# Patient Record
Sex: Female | Born: 1995 | Race: Black or African American | Hispanic: No | Marital: Single
Health system: Southern US, Community
[De-identification: ages and names within clinical notes are randomized; demographics above are authoritative.]

## PROBLEM LIST (undated history)

## (undated) ENCOUNTER — Inpatient Hospital Stay (HOSPITAL_COMMUNITY): Payer: Self-pay

## (undated) DIAGNOSIS — T7840XA Allergy, unspecified, initial encounter: Secondary | ICD-10-CM

## (undated) DIAGNOSIS — K5909 Other constipation: Secondary | ICD-10-CM

## (undated) DIAGNOSIS — Z91410 Personal history of adult physical and sexual abuse: Secondary | ICD-10-CM

## (undated) HISTORY — DX: Personal history of adult physical and sexual abuse: Z91.410

## (undated) HISTORY — PX: NO PAST SURGERIES: SHX2092

## (undated) HISTORY — DX: Other constipation: K59.09

---
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
## 2013-01-14 ENCOUNTER — Inpatient Hospital Stay (HOSPITAL_COMMUNITY)
Admission: AD | Admit: 2013-01-14 | Discharge: 2013-01-14 | Disposition: A | Discharge status: Home / Self Care | Payer: BC Managed Care – PPO | Source: Ambulatory Visit | Attending: Obstetrics and Gynecology | Admitting: Obstetrics and Gynecology

## 2013-01-14 DIAGNOSIS — R109 Unspecified abdominal pain: Secondary | ICD-10-CM | POA: Insufficient documentation

## 2013-01-14 DIAGNOSIS — B9789 Other viral agents as the cause of diseases classified elsewhere: Secondary | ICD-10-CM | POA: Insufficient documentation

## 2013-01-14 DIAGNOSIS — J029 Acute pharyngitis, unspecified: Secondary | ICD-10-CM | POA: Insufficient documentation

## 2013-01-14 DIAGNOSIS — B349 Viral infection, unspecified: Secondary | ICD-10-CM

## 2013-01-14 DIAGNOSIS — M542 Cervicalgia: Secondary | ICD-10-CM | POA: Insufficient documentation

## 2013-01-14 LAB — RAPID STREP SCREEN (MED CTR MEBANE ONLY): Streptococcus, Group A Screen (Direct): NEGATIVE

## 2013-01-14 LAB — URINALYSIS, ROUTINE W REFLEX MICROSCOPIC
Hgb urine dipstick: NEGATIVE
Leukocytes, UA: NEGATIVE
Nitrite: NEGATIVE
Specific Gravity, Urine: 1.02 (ref 1.005–1.030)
Urobilinogen, UA: 0.2 mg/dL (ref 0.0–1.0)
pH: 6 (ref 5.0–8.0)

## 2013-01-14 LAB — POCT PREGNANCY, URINE: Preg Test, Ur: NEGATIVE

## 2013-01-14 MED ORDER — ACETAMINOPHEN 325 MG PO TABS: 650.0000 mg | ORAL_TABLET | Freq: Once | ORAL | Status: DC

## 2013-01-14 MED ORDER — IBUPROFEN 600 MG PO TABS
600.0000 mg | ORAL_TABLET | Freq: Once | ORAL | Status: AC
  Administered 2013-01-14: 600 mg via ORAL
  Filled 2013-01-14: qty 1

## 2013-01-14 NOTE — MAU Provider Note (Signed)
History     CSN: [id number]  Arrival date and time: 01/14/13 3086   Seen by provider at 0710     Chief Complaint  Patient presents with  . Abdominal Pain  . Sore Throat  . Neck Pain   HPI Bailey Carlson 17 y.o. Comes to MAU today with sore throat, neck pain and abdominal pain.  Took tylenol at 3 am and it did not help.  Has vomited phlegm.  Has not had menses in 3 months.  Is taking birth control pills and reports taking pills daily.  [facility].  OB History    Grav Para Term Preterm Abortions TAB SAB Ect Mult Living   0               Past Medical History  Diagnosis Date  . Chronic constipation   . Allergy     Past Surgical History  Procedure Date  . No past surgeries     Family History  Problem Relation Age of Onset  . Hirschsprung's disease Neg Hx   . Hypertension Mother   . Hypertension Father     History  Substance Use Topics  . Smoking status: Current Every Day Smoker -- 0.2 packs/day  . Smokeless tobacco: Current User  . Alcohol Use: 1.2 oz/week    2 Shots of liquor per week     Comment: one Blunt every month    Allergies:  Allergies  Allergen Reactions  . Food Hives and Rash    Bananas and Apples    Prescriptions prior to admission  Medication Sig Dispense Refill  . desonide (DESOWEN) 0.05 % cream Apply 1 application topically 2 (two) times daily.      . Multiple Vitamin (MULTIVITAMIN WITH MINERALS) TABS Take 1 tablet by mouth daily.        Review of Systems  Constitutional: Positive for fever.  HENT: Positive for sore throat and neck pain.   Gastrointestinal: Positive for nausea, vomiting and abdominal pain. Negative for diarrhea and constipation.  Genitourinary:       No vaginal discharge. No vaginal bleeding. No dysuria.   Physical Exam   Blood pressure 127/88, pulse 130, resp. rate 18, height 5\' 2"  (1.575 m), weight 82.781 kg (182 lb 8 oz), last menstrual period 11/14/2012. Temp 100.8, on recheck  pulse was 112 Physical Exam  Nursing note and vitals reviewed. Constitutional: She is oriented to person, place, and time. She appears well-developed and well-nourished.  HENT:  Head: Normocephalic.       Throat very light pink.  No exudate on tonsils.  No nasal congestion.  No cough.  Eyes: EOM are normal.  Neck: Neck supple.       No lymph nodes palpated.  Soreness is at the base of her neck.  Can touch her chin to her chest with no pain.  GI: Soft. Bowel sounds are normal. There is no tenderness. There is no rebound and no guarding.       Very mild tenderness in LLQ.  No pain with palpation.  Musculoskeletal: Normal range of motion.  Neurological: She is alert and oriented to person, place, and time.  Skin: Skin is warm and dry.  Psychiatric: She has a normal mood and affect.    MAU Course  Procedures Results for orders placed during the hospital encounter of 01/14/13 (from the past 24 hour(s))  URINALYSIS, ROUTINE W REFLEX MICROSCOPIC     Status: Normal   Collection Time  01/14/13  6:34 AM      Component Value Range   Color, Urine YELLOW  YELLOW   APPearance CLEAR  CLEAR   Specific Gravity, Urine 1.020  1.005 - 1.030   pH 6.0  5.0 - 8.0   Glucose, UA NEGATIVE  NEGATIVE mg/dL   Hgb urine dipstick NEGATIVE  NEGATIVE   Bilirubin Urine NEGATIVE  NEGATIVE   Ketones, ur NEGATIVE  NEGATIVE mg/dL   Protein, ur NEGATIVE  NEGATIVE mg/dL   Urobilinogen, UA 0.2  0.0 - 1.0 mg/dL   Nitrite NEGATIVE  NEGATIVE   Leukocytes, UA NEGATIVE  NEGATIVE  POCT PREGNANCY, URINE     Status: Normal   Collection Time   01/14/13  6:40 AM      Component Value Range   Preg Test, Ur NEGATIVE  NEGATIVE   MDM Strep test done - results pending 0740 Consult with Dr. Tenny Craw re: plan of care  Assessment and Plan  Sore throat Viral syndrome  Plan Alternate ibuprofen and tylenol according to the package directions. Drink at least 8 8-oz glasses of water every day. Increase rest in bed. Seek additional  care at Urgent Care if your condition worsens.  BURLESON,TERRI 01/14/2013, 7:24 AM

## 2013-01-14 NOTE — MAU Note (Signed)
Sore throat, stomach ache, and neck pains for the last 24 hours.

## 2013-01-24 ENCOUNTER — Emergency Department (HOSPITAL_COMMUNITY)
Admission: EM | Admit: 2013-01-24 | Discharge: 2013-01-24 | Disposition: A | Discharge status: Home / Self Care | Payer: BC Managed Care – PPO | Attending: Emergency Medicine | Admitting: Emergency Medicine

## 2013-01-24 ENCOUNTER — Encounter (HOSPITAL_COMMUNITY): Payer: Self-pay | Admitting: *Deleted

## 2013-01-24 DIAGNOSIS — R109 Unspecified abdominal pain: Secondary | ICD-10-CM | POA: Insufficient documentation

## 2013-01-24 DIAGNOSIS — M545 Low back pain, unspecified: Secondary | ICD-10-CM | POA: Insufficient documentation

## 2013-01-24 DIAGNOSIS — Z79899 Other long term (current) drug therapy: Secondary | ICD-10-CM | POA: Insufficient documentation

## 2013-01-24 DIAGNOSIS — F17200 Nicotine dependence, unspecified, uncomplicated: Secondary | ICD-10-CM | POA: Insufficient documentation

## 2013-01-24 DIAGNOSIS — N898 Other specified noninflammatory disorders of vagina: Secondary | ICD-10-CM | POA: Insufficient documentation

## 2013-01-24 DIAGNOSIS — Z3202 Encounter for pregnancy test, result negative: Secondary | ICD-10-CM | POA: Insufficient documentation

## 2013-01-24 DIAGNOSIS — Z8719 Personal history of other diseases of the digestive system: Secondary | ICD-10-CM | POA: Insufficient documentation

## 2013-01-24 DIAGNOSIS — K5289 Other specified noninfective gastroenteritis and colitis: Secondary | ICD-10-CM | POA: Insufficient documentation

## 2013-01-24 DIAGNOSIS — K529 Noninfective gastroenteritis and colitis, unspecified: Secondary | ICD-10-CM

## 2013-01-24 DIAGNOSIS — R197 Diarrhea, unspecified: Secondary | ICD-10-CM | POA: Insufficient documentation

## 2013-01-24 LAB — POCT I-STAT, CHEM 8
BUN: 7 mg/dL (ref 6–23)
Calcium, Ion: 1.2 mmol/L (ref 1.12–1.23)
Chloride: 104 meq/L (ref 96–112)
Creatinine, Ser: 0.8 mg/dL (ref 0.50–1.10)
Glucose, Bld: 113 mg/dL — ABNORMAL HIGH (ref 70–99)
HCT: 45 % (ref 36.0–46.0)
Hemoglobin: 15.3 g/dL — ABNORMAL HIGH (ref 12.0–15.0)
Potassium: 3.9 meq/L (ref 3.5–5.1)
Sodium: 140 meq/L (ref 135–145)
TCO2: 26 mmol/L (ref 0–100)

## 2013-01-24 LAB — URINALYSIS, ROUTINE W REFLEX MICROSCOPIC
Bilirubin Urine: NEGATIVE
Glucose, UA: NEGATIVE mg/dL
Ketones, ur: NEGATIVE mg/dL
Leukocytes, UA: NEGATIVE
Protein, ur: NEGATIVE mg/dL
Urobilinogen, UA: 0.2 mg/dL (ref 0.0–1.0)

## 2013-01-24 LAB — URINE MICROSCOPIC-ADD ON

## 2013-01-24 LAB — POCT PREGNANCY, URINE: Preg Test, Ur: NEGATIVE

## 2013-01-24 MED ORDER — KETOROLAC TROMETHAMINE 30 MG/ML IJ SOLN
30.0000 mg | Freq: Once | INTRAMUSCULAR | Status: AC
  Administered 2013-01-24: 30 mg via INTRAVENOUS
  Filled 2013-01-24: qty 1

## 2013-01-24 MED ORDER — SODIUM CHLORIDE 0.9 % IV BOLUS (SEPSIS)
1000.0000 mL | Freq: Once | INTRAVENOUS | Status: AC
  Administered 2013-01-24: 1000 mL via INTRAVENOUS

## 2013-01-24 MED ORDER — ONDANSETRON HCL 4 MG/2ML IJ SOLN
4.0000 mg | Freq: Once | INTRAMUSCULAR | Status: AC
  Administered 2013-01-24: 4 mg via INTRAVENOUS
  Filled 2013-01-24: qty 2

## 2013-01-24 MED ORDER — ONDANSETRON HCL 4 MG PO TABS: 4.0000 mg | ORAL_TABLET | Freq: Four times a day (QID) | ORAL | Status: DC

## 2013-01-24 NOTE — ED Notes (Signed)
Pt brought to room; pt getting undressed and into a gown at this time 

## 2013-01-24 NOTE — ED Notes (Signed)
Pt laying on stretcher talking on her cell phone, no needs at this time

## 2013-01-24 NOTE — ED Provider Notes (Signed)
History     CSN: [id number]  Arrival date & time 01/24/13  1428   First MD Initiated Contact with Patient 01/24/13 1513      Chief Complaint  Patient presents with  . Nausea  . Emesis  . Diarrhea    (Consider location/radiation/quality/duration/timing/severity/associated sxs/prior treatment) Patient is a 17 y.o. female presenting with vomiting and diarrhea. The history is provided by the patient.  Emesis  This is a new problem. The problem occurs more than 10 times per day. The emesis has an appearance of stomach contents. There has been no fever. Associated symptoms include abdominal pain and diarrhea. Pertinent negatives include no chills, no cough, no fever, no headaches and no myalgias. Associated symptoms comments: N, V, D onset this morning around 5:00 a.m. with multiple episodes of both vomiting and diarrhea without blood in either. No fever. She has lower abdominal and low back pain that she attributes to an usually heavy period. No vaginal discharge, dysuria, near syncope or dizziness. She denies URI symptoms. .  Diarrhea The primary symptoms include abdominal pain, vomiting and diarrhea. Primary symptoms do not include fever, dysuria, myalgias or rash.  The illness is also significant for back pain. The illness does not include chills.    Past Medical History  Diagnosis Date  . Chronic constipation   . Allergy     Past Surgical History  Procedure Date  . No past surgeries     Family History  Problem Relation Age of Onset  . Hirschsprung's disease Neg Hx   . Hypertension Mother   . Hypertension Father     History  Substance Use Topics  . Smoking status: Current Every Day Smoker -- 0.2 packs/day  . Smokeless tobacco: Current User  . Alcohol Use: 1.2 oz/week    2 Shots of liquor per week     Comment: one Blunt every month    OB History    Grav Para Term Preterm Abortions TAB SAB Ect Mult Living   0               Review of Systems  Constitutional:  Negative for fever and chills.  HENT: Negative.  Negative for congestion and trouble swallowing.   Respiratory: Negative.  Negative for cough and shortness of breath.   Cardiovascular: Negative.  Negative for chest pain.  Gastrointestinal: Positive for vomiting, abdominal pain and diarrhea. Negative for blood in stool.  Genitourinary: Positive for vaginal bleeding. Negative for dysuria and vaginal discharge.  Musculoskeletal: Positive for back pain. Negative for myalgias.  Skin: Negative.  Negative for rash.  Neurological: Negative.  Negative for headaches.  Hematological: Does not bruise/bleed easily.  Psychiatric/Behavioral: Negative for confusion.    Allergies  Food  Home Medications   Current Outpatient Rx  Name  Route  Sig  Dispense  Refill  . DESONIDE 0.05 % EX CREA   Topical   Apply 1 application topically 2 (two) times daily.         . ADULT MULTIVITAMIN W/MINERALS CH   Oral   Take 1 tablet by mouth daily.           BP 142/88  Pulse 121  Temp 98 F (36.7 C) (Oral)  Resp 18  SpO2 99%  LMP 11/14/2012  Physical Exam  Constitutional: She is oriented to person, place, and time. She appears well-developed and well-nourished.  HENT:  Head: Normocephalic.  Mouth/Throat: Oropharynx is clear and moist.  Neck: Normal range of motion. Neck supple.  Cardiovascular: Regular rhythm.  Tachycardia present.   No murmur heard. Pulmonary/Chest: Effort normal and breath sounds normal. She has no wheezes. She has no rales.  Abdominal: Soft. Bowel sounds are normal. There is no tenderness. There is no rebound and no guarding.  Musculoskeletal: Normal range of motion. She exhibits no edema.  Neurological: She is alert and oriented to person, place, and time.  Skin: Skin is warm and dry. No rash noted.  Psychiatric: She has a normal mood and affect.    ED Course  Procedures (including critical care time)  Labs Reviewed  URINALYSIS, ROUTINE W REFLEX MICROSCOPIC - Abnormal;  Notable for the following:    Hgb urine dipstick MODERATE (*)     All other components within normal limits  POCT I-STAT, CHEM 8 - Abnormal; Notable for the following:    Glucose, Bld 113 (*)     Hemoglobin 15.3 (*)     All other components within normal limits  POCT PREGNANCY, URINE  URINE MICROSCOPIC-ADD ON   Results for orders placed during the hospital encounter of 01/24/13  URINALYSIS, ROUTINE W REFLEX MICROSCOPIC      Component Value Range   Color, Urine YELLOW  YELLOW   APPearance CLEAR  CLEAR   Specific Gravity, Urine 1.029  1.005 - 1.030   pH 6.0  5.0 - 8.0   Glucose, UA NEGATIVE  NEGATIVE mg/dL   Hgb urine dipstick MODERATE (*) NEGATIVE   Bilirubin Urine NEGATIVE  NEGATIVE   Ketones, ur NEGATIVE  NEGATIVE mg/dL   Protein, ur NEGATIVE  NEGATIVE mg/dL   Urobilinogen, UA 0.2  0.0 - 1.0 mg/dL   Nitrite NEGATIVE  NEGATIVE   Leukocytes, UA NEGATIVE  NEGATIVE  POCT PREGNANCY, URINE      Component Value Range   Preg Test, Ur NEGATIVE  NEGATIVE  URINE MICROSCOPIC-ADD ON      Component Value Range   Squamous Epithelial / LPF RARE  RARE   WBC, UA 0-2  <3 WBC/hpf   RBC / HPF 0-2  <3 RBC/hpf   Urine-Other MUCOUS PRESENT    POCT I-STAT, CHEM 8      Component Value Range   Sodium 140  135 - 145 mEq/L   Potassium 3.9  3.5 - 5.1 mEq/L   Chloride 104  96 - 112 mEq/L   BUN 7  6 - 23 mg/dL   Creatinine, Ser 1.61  0.50 - 1.10 mg/dL   Glucose, Bld 096 (*) 70 - 99 mg/dL   Calcium, Ion 0.45  1.12 - 1.23 mmol/L   TCO2 26  0 - 100 mmol/L   Hemoglobin 15.3 (*) 12.0 - 15.0 g/dL   HCT 40.9  81.1 - 91.4 %    No results found.   No diagnosis found.  1. Gastroenteritis 2. menstrual cramps   MDM  She is feeling better after IV fluids, Toradol and Zofran. Tolerating PO fluids without difficulty. No further vomiting. No diarrhea in ED. Vital signs improved. She reports period is heavier than usual, denies vaginal discharge and states "my back pain is from my period". Do not feel  further evaluation is required. Stable for discharge.         Arnoldo Hooker, PA-C 01/24/13 1738

## 2013-01-24 NOTE — ED Notes (Signed)
Oral fluid trial given; will report to [place], [place]

## 2013-01-24 NOTE — ED Notes (Signed)
Patient reports onset of n/v/d today at 0100.  She states she did not have a period for 2 mths,  She started bleeding on yesterday only small amount.  Patient states today she is having heavy period with clots.  Patient is also complaining of back pain

## 2013-01-24 NOTE — ED Provider Notes (Signed)
Medical screening examination/treatment/procedure(s) were performed by non-physician practitioner and as supervising physician I was immediately available for consultation/collaboration. Devoria Albe, MD, Armando Gang   Ward Givens, MD 01/24/13 [phone]

## 2013-03-02 ENCOUNTER — Encounter (HOSPITAL_COMMUNITY): Payer: Self-pay

## 2013-03-02 DIAGNOSIS — Z3202 Encounter for pregnancy test, result negative: Secondary | ICD-10-CM | POA: Insufficient documentation

## 2013-03-02 DIAGNOSIS — R112 Nausea with vomiting, unspecified: Secondary | ICD-10-CM | POA: Insufficient documentation

## 2013-03-02 DIAGNOSIS — F17200 Nicotine dependence, unspecified, uncomplicated: Secondary | ICD-10-CM | POA: Insufficient documentation

## 2013-03-02 LAB — COMPREHENSIVE METABOLIC PANEL
ALT: 12 U/L (ref 0–35)
AST: 22 U/L (ref 0–37)
Albumin: 4 g/dL (ref 3.5–5.2)
Alkaline Phosphatase: 64 U/L (ref 39–117)
BUN: 10 mg/dL (ref 6–23)
Chloride: 99 mEq/L (ref 96–112)
Potassium: 3.6 mEq/L (ref 3.5–5.1)
Sodium: 136 mEq/L (ref 135–145)
Total Bilirubin: 0.3 mg/dL (ref 0.3–1.2)
Total Protein: 8.8 g/dL — ABNORMAL HIGH (ref 6.0–8.3)

## 2013-03-02 LAB — URINALYSIS, MICROSCOPIC ONLY
Glucose, UA: NEGATIVE mg/dL
Ketones, ur: NEGATIVE mg/dL
Leukocytes, UA: NEGATIVE
Nitrite: NEGATIVE
Protein, ur: NEGATIVE mg/dL
Specific Gravity, Urine: 1.036 — ABNORMAL HIGH (ref 1.005–1.030)

## 2013-03-02 LAB — CBC WITH DIFFERENTIAL/PLATELET
Basophils Absolute: 0 10*3/uL (ref 0.0–0.1)
Basophils Relative: 0 % (ref 0–1)
Eosinophils Absolute: 0.2 10*3/uL (ref 0.0–0.7)
Eosinophils Relative: 1 % (ref 0–5)
MCH: 29.3 pg (ref 26.0–34.0)
MCHC: 35.3 g/dL (ref 30.0–36.0)
MCV: 83.1 fL (ref 78.0–100.0)
Platelets: 304 10*3/uL (ref 150–400)
RDW: 14 % (ref 11.5–15.5)

## 2013-03-02 LAB — LIPASE, BLOOD: Lipase: 25 U/L (ref 11–59)

## 2013-03-02 NOTE — ED Notes (Signed)
Re-assessed w/ VS. Patient reports feeling better after most recent episode of emesis. Still has "bad" headache. Patient back to lobby to continue to wait on room placement

## 2013-03-02 NOTE — ED Notes (Signed)
Patient reports an acute onset of lower abdominal pain, nausea and vomiting since 1830. Ate at [facility] around 2 pm and hasn't had anything else to eat since then. Also complains of a headache. Denies constipation or diarrhea. LMP end of January

## 2013-03-02 NOTE — ED Notes (Signed)
Nausea and vomiting in triage room #3.

## 2013-03-03 ENCOUNTER — Emergency Department (HOSPITAL_COMMUNITY)
Admission: EM | Admit: 2013-03-03 | Discharge: 2013-03-03 | Discharge status: Against Medical Advice | Payer: BC Managed Care – PPO | Attending: Emergency Medicine | Admitting: Emergency Medicine

## 2014-10-06 ENCOUNTER — Emergency Department (HOSPITAL_COMMUNITY)
Admission: EM | Admit: 2014-10-06 | Discharge: 2014-10-06 | Disposition: A | Discharge status: Home / Self Care | Payer: BC Managed Care – PPO | Attending: Emergency Medicine | Admitting: Emergency Medicine

## 2014-10-06 ENCOUNTER — Encounter (HOSPITAL_COMMUNITY): Payer: Self-pay | Admitting: Emergency Medicine

## 2014-10-06 DIAGNOSIS — Z3202 Encounter for pregnancy test, result negative: Secondary | ICD-10-CM | POA: Diagnosis not present

## 2014-10-06 DIAGNOSIS — T483X2A Poisoning by antitussives, intentional self-harm, initial encounter: Secondary | ICD-10-CM | POA: Diagnosis not present

## 2014-10-06 DIAGNOSIS — Z720 Tobacco use: Secondary | ICD-10-CM | POA: Insufficient documentation

## 2014-10-06 DIAGNOSIS — R112 Nausea with vomiting, unspecified: Secondary | ICD-10-CM | POA: Diagnosis present

## 2014-10-06 DIAGNOSIS — F4321 Adjustment disorder with depressed mood: Secondary | ICD-10-CM | POA: Diagnosis present

## 2014-10-06 DIAGNOSIS — Z8719 Personal history of other diseases of the digestive system: Secondary | ICD-10-CM | POA: Diagnosis not present

## 2014-10-06 DIAGNOSIS — T450X2A Poisoning by antiallergic and antiemetic drugs, intentional self-harm, initial encounter: Secondary | ICD-10-CM

## 2014-10-06 DIAGNOSIS — T484X2A Poisoning by expectorants, intentional self-harm, initial encounter: Secondary | ICD-10-CM

## 2014-10-06 DIAGNOSIS — Y939 Activity, unspecified: Secondary | ICD-10-CM | POA: Diagnosis not present

## 2014-10-06 DIAGNOSIS — Y929 Unspecified place or not applicable: Secondary | ICD-10-CM | POA: Diagnosis not present

## 2014-10-06 DIAGNOSIS — R10812 Left upper quadrant abdominal tenderness: Secondary | ICD-10-CM | POA: Diagnosis not present

## 2014-10-06 LAB — COMPREHENSIVE METABOLIC PANEL
ALT: 8 U/L (ref 0–35)
ANION GAP: 17 — AB (ref 5–15)
AST: 18 U/L (ref 0–37)
Albumin: 4.3 g/dL (ref 3.5–5.2)
Alkaline Phosphatase: 60 U/L (ref 39–117)
BILIRUBIN TOTAL: 0.3 mg/dL (ref 0.3–1.2)
BUN: 7 mg/dL (ref 6–23)
CHLORIDE: 101 meq/L (ref 96–112)
CO2: 22 mEq/L (ref 19–32)
CREATININE: 0.75 mg/dL (ref 0.50–1.10)
Calcium: 9.4 mg/dL (ref 8.4–10.5)
GFR calc non Af Amer: >90 mL/min (ref >90)
Glucose, Bld: 111 mg/dL — ABNORMAL HIGH (ref 70–99)
Potassium: 3.4 mEq/L — ABNORMAL LOW (ref 3.7–5.3)
Sodium: 140 mEq/L (ref 137–147)
Total Protein: 9.1 g/dL — ABNORMAL HIGH (ref 6.0–8.3)

## 2014-10-06 LAB — CBC WITH DIFFERENTIAL/PLATELET
Basophils Absolute: 0 10*3/uL (ref 0.0–0.1)
Basophils Relative: 0 % (ref 0–1)
Eosinophils Absolute: 0 10*3/uL (ref 0.0–0.7)
Eosinophils Relative: 0 % (ref 0–5)
HEMATOCRIT: 36.1 % (ref 36.0–46.0)
Hemoglobin: 12.4 g/dL (ref 12.0–15.0)
LYMPHS PCT: 12 % (ref 12–46)
Lymphs Abs: 2.2 10*3/uL (ref 0.7–4.0)
MCH: 28.6 pg (ref 26.0–34.0)
MCHC: 34.3 g/dL (ref 30.0–36.0)
MCV: 83.4 fL (ref 78.0–100.0)
MONO ABS: 0.5 10*3/uL (ref 0.1–1.0)
MONOS PCT: 3 % (ref 3–12)
NEUTROS ABS: 15.5 10*3/uL — AB (ref 1.7–7.7)
Neutrophils Relative %: 85 % — ABNORMAL HIGH (ref 43–77)
Platelets: 354 10*3/uL (ref 150–400)
RBC: 4.33 MIL/uL (ref 3.87–5.11)
RDW: 13.6 % (ref 11.5–15.5)
WBC: 18.3 10*3/uL — ABNORMAL HIGH (ref 4.0–10.5)

## 2014-10-06 LAB — URINALYSIS, ROUTINE W REFLEX MICROSCOPIC
Bilirubin Urine: NEGATIVE
Glucose, UA: NEGATIVE mg/dL
KETONES UR: NEGATIVE mg/dL
Leukocytes, UA: NEGATIVE
Nitrite: NEGATIVE
PROTEIN: NEGATIVE mg/dL
Specific Gravity, Urine: 1.008 (ref 1.005–1.030)
Urobilinogen, UA: 0.2 mg/dL (ref 0.0–1.0)
pH: 6 (ref 5.0–8.0)

## 2014-10-06 LAB — URINE MICROSCOPIC-ADD ON

## 2014-10-06 LAB — RAPID URINE DRUG SCREEN, HOSP PERFORMED
AMPHETAMINES: NOT DETECTED
Barbiturates: NOT DETECTED
Benzodiazepines: NOT DETECTED
Cocaine: NOT DETECTED
OPIATES: NOT DETECTED
TETRAHYDROCANNABINOL: POSITIVE — AB

## 2014-10-06 LAB — ACETAMINOPHEN LEVEL
Acetaminophen (Tylenol), Serum: <15 ug/mL (ref 10–30)
Acetaminophen (Tylenol), Serum: <15 ug/mL (ref 10–30)

## 2014-10-06 LAB — PREGNANCY, URINE: Preg Test, Ur: NEGATIVE

## 2014-10-06 LAB — ETHANOL

## 2014-10-06 LAB — SALICYLATE LEVEL: Salicylate Lvl: <2 mg/dL — ABNORMAL LOW (ref 2.8–20.0)

## 2014-10-06 MED ORDER — ONDANSETRON HCL 4 MG/2ML IJ SOLN: 4.0000 mg | Freq: Once | INTRAMUSCULAR | Status: DC

## 2014-10-06 NOTE — BH Assessment (Signed)
Discharge home per Dr. Jannifer FranklinAkintayo with outpatient therapy. Patient given the following appointment but sts she is unable to make it tomorrow due to work. Patient instructed to contact the provider listed below and coordinate another appointment time. Dr. Jannifer FranklinAkintayo made aware of appointment.  Appointment (Thursday, October 07, 2014 @ 1pm) Dierdre Highmanhristine Enderline M.Ed., [facility], [facility], [facility] [address] [place], [place] [address] [phone]

## 2014-10-06 NOTE — BHH Suicide Risk Assessment (Signed)
   Demographic Factors:  Female and employed  Total Time spent with patient: 20 minutes  Psychiatric Specialty Exam: Physical Exam  ROS  Blood pressure 119/69, pulse 93, temperature 98 F (36.7 C), temperature source Oral, resp. rate 18, SpO2 98.00%.There is no weight on file to calculate BMI.  General Appearance: Fairly Groomed  Patent attorneyye Contact::  Negative  Speech:  Clear and Coherent  Volume:  Normal  Mood:  Euthymic  Affect:  Appropriate  Thought Process:  Goal Directed  Orientation:  Full (Time, Place, and Person)  Thought Content:  Negative  Suicidal Thoughts:  No  Homicidal Thoughts:  No  Memory:  Immediate;   Fair Recent;   Fair Remote;   Fair  Judgement:  Fair  Insight:  Fair  Psychomotor Activity:  Normal  Concentration:  Good  Recall:  Good  Fund of Knowledge:Good  Language: Good  Akathisia:  No  Handed:  Right  AIMS (if indicated):     Assets:  Communication Skills Desire for Improvement Physical Health  Sleep:       Musculoskeletal: Strength & Muscle Tone: within normal limits Gait & Station: normal Patient leans: N/A   Mental Status Per Nursing Assessment::   On Admission:     Current Mental Status by Physician: patient denies suicidal ideation, intent or plan  Loss Factors: NA  Historical Factors: NA  Risk Reduction Factors:   Sense of responsibility to family, Living with another person, especially a relative and Positive social support  Continued Clinical Symptoms:  Resolving depression  Cognitive Features That Contribute To Risk:  Closed-mindedness    Suicide Risk:  Minimal: No identifiable suicidal ideation.  Patients presenting with no risk factors but with morbid ruminations; may be classified as minimal risk based on the severity of the depressive symptoms  Discharge Diagnoses:   AXIS I:  Adjustment Disorder with Depressed Mood AXIS II:  Deferred AXIS III:   Past Medical History  Diagnosis Date  . Chronic constipation   .  Allergy    AXIS IV:  other psychosocial or environmental problems and problems related to social environment AXIS V:  61-70 mild symptoms  Plan Of Care/Follow-up recommendations:  Activity:  as tolerated Diet:  healthy  Is patient on multiple antipsychotic therapies at discharge:  No   Has Patient had three or more failed trials of antipsychotic monotherapy by history:  No  Recommended Plan for Multiple Antipsychotic Therapies: NA    Thedore MinsAkintayo, Layah Skousen, MD 10/06/2014, 10:15 AM

## 2014-10-06 NOTE — Discharge Instructions (Signed)
Adjustment Disorder °Most changes in life can cause stress. Getting used to changes may take a few months or longer. If feelings of stress, hopelessness, or worry continue, you may have an adjustment disorder. This stress-related mental health problem may affect your feelings, thinking and how you act. It occurs in both sexes and happens at any age. °SYMPTOMS  °Some of the following problems may be seen and vary from person to person: °· Sadness or depression. °· Loss of enjoyment. °· Thoughts of suicide. °· Fighting. °· Avoiding family and friends. °· Poor school performance. °· Hopelessness, sense of loss. °· Trouble sleeping. °· Vandalism. °· Worry, weight loss or gain. °· Crying spells. °· Anxiety °· Reckless driving. °· Skipping school. °· Poor work performance. °· Nervousness. °· Ignoring bills. °· Poor attitude. °DIAGNOSIS  °Your caregiver will ask what has happened in your life and do a physical exam. They will make a diagnosis of an adjustment disorder when they are sure another problem or medical illness causing your feelings does not exist. °TREATMENT  °When problems caused by stress interfere with you daily life or last longer than a few months, you may need counseling for an adjustment disorder. Early treatment may diminish problems and help you to better cope with the stressful events in your life. Sometimes medication is necessary. Individual counseling and or support groups can be very helpful. °PROGNOSIS  °Adjustment disorders usually last less than 3 to 6 months. The condition may persist if there is long lasting stress. This could include health problems, relationship problems, or job difficulties where you can not easily escape from what is causing the problem. °PREVENTION  °Even the most mentally healthy, highly functioning people can suffer from an adjustment disorder given a significant blow from a life-changing event. There is no way to prevent pain and loss. Most people need help from time  to time. You are not alone. °SEEK MEDICAL CARE IF:  °Your feelings or symptoms listed above do not improve or worsen. °Document Released: 08/14/2006 Document Revised: 03/03/2012 Document Reviewed: 11/05/2007 °ExitCare® Patient Information ©2015 [employer]. This information is not intended to replace advice given to you by your health care provider. Make sure you discuss any questions you have with your health care provider. ° °

## 2014-10-06 NOTE — ED Notes (Addendum)
Pt's belongings given to [facility] RN and included:  Red Purse with black wallet ($65.0 dollars and 2 credit cards locked up with Security) Cigarette/Lighter Set of keys x 2  Black bra Green Pants Asa SaunasGray Underwear One blue and one purple sock One black and one white t-shirt Blue T-mobile cell phone with Consulting civil engineercharger

## 2014-10-06 NOTE — ED Notes (Signed)
Patient denies SI, HI, AVH at present. Patient has ring and earrings on her person.   Oriented to the unit.  Q 15 safety checks in place.

## 2014-10-06 NOTE — ED Notes (Signed)
Pt states she is feeling better .

## 2014-10-06 NOTE — ED Notes (Signed)
Pt depressed admits to  Overdosing on intention of Benadryl 25 mg quanity 15 and Mucinex (6),  States she just wants to end it all

## 2014-10-06 NOTE — ED Notes (Signed)
Spoke with Patty at poison control,  She said watch for 6 hours post ingestion,  QRS  Widening,  Tachcarydia,  Hypertension,  Agitation,  Sedation,  Too late for charcoal,  ED ekg,  Give Benzos for agitation and seizures,  And IV fluids ,  Check tylenol level at 330 due to Benadryl slows down the gut.

## 2014-10-06 NOTE — ED Provider Notes (Signed)
CSN: [id number]     Arrival date & time 10/06/14  [id number] History   First MD Initiated Contact with Patient 10/06/14 0052     Chief Complaint  Patient presents with  . Ingestion     (Consider location/radiation/quality/duration/timing/severity/associated sxs/prior Treatment) HPI Patient presents with intentional overdose this evening around midnight. She states she took 15 tablets of 25 mg Benadryl and 6/10 Mucinex. She admits to attempting to kill herself. She states she's never been diagnosed with mental illness. She is on no psychiatric medication. She's no previous suicide attempts. States that nothing has been going right in her life and was wanting to end it all. She's had nausea with several episodes of vomiting since overdose. She was a mild left upper quadrant pain and drowsiness. Denies any other coingestants. Patient denies any hallucinations. Past Medical History  Diagnosis Date  . Chronic constipation   . Allergy    Past Surgical History  Procedure Laterality Date  . No past surgeries     Family History  Problem Relation Age of Onset  . Hirschsprung's disease Neg Hx   . Hypertension Mother   . Hypertension Father    History  Substance Use Topics  . Smoking status: Current Every Day Smoker -- 0.25 packs/day  . Smokeless tobacco: Never Used  . Alcohol Use: 1.2 oz/week    2 Shots of liquor per week     Comment: one Blunt every month   OB History   Grav Para Term Preterm Abortions TAB SAB Ect Mult Living   0              Review of Systems  Constitutional: Negative for fever and chills.  Respiratory: Negative for cough and shortness of breath.   Cardiovascular: Negative for chest pain.  Gastrointestinal: Positive for nausea, vomiting and abdominal pain. Negative for diarrhea and constipation.  Musculoskeletal: Negative for back pain, myalgias, neck pain and neck stiffness.  Skin: Negative for rash and wound.  Neurological: Negative for dizziness, syncope,  weakness, numbness and headaches.  Psychiatric/Behavioral: Positive for suicidal ideas. Negative for hallucinations, confusion and agitation.  All other systems reviewed and are negative.     Allergies  Food  Home Medications   Prior to Admission medications   Medication Sig Start Date End Date Taking? Authorizing Provider  diphenhydrAMINE (BENADRYL) 25 MG tablet Take 375 mg by mouth once.   Yes Historical Provider, MD  guaiFENesin (MUCINEX) 600 MG 12 hr tablet Take 3,600 mg by mouth once.   Yes Historical Provider, MD   BP 119/69  Pulse 93  Temp(Src) 98 F (36.7 C) (Oral)  Resp 18  SpO2 98% Physical Exam  Nursing note and vitals reviewed. Constitutional: She is oriented to person, place, and time. She appears well-developed and well-nourished. No distress.  HENT:  Head: Normocephalic and atraumatic.  Mouth/Throat: Oropharynx is clear and moist.  Eyes: EOM are normal. Pupils are equal, round, and reactive to light.  Neck: Normal range of motion. Neck supple.  Cardiovascular: Normal rate and regular rhythm.   Pulmonary/Chest: Effort normal and breath sounds normal. No respiratory distress. She has no wheezes. She has no rales. She exhibits no tenderness.  Abdominal: Soft. Bowel sounds are normal. She exhibits no distension and no mass. There is tenderness (mild left upper quadrant tenderness to palpation.). There is no rebound and no guarding.  Musculoskeletal: Normal range of motion. She exhibits no edema and no tenderness.  Neurological: She is alert and oriented to person, place, and time.  Skin: Skin is warm and dry. No rash noted. No erythema.  Psychiatric:  Dysphoric mood    ED Course  Procedures (including critical care time) Labs Review Labs Reviewed  CBC WITH DIFFERENTIAL - Abnormal; Notable for the following:    WBC 18.3 (*)    Neutrophils Relative % 85 (*)    Neutro Abs 15.5 (*)    All other components within normal limits  COMPREHENSIVE METABOLIC PANEL -  Abnormal; Notable for the following:    Potassium 3.4 (*)    Glucose, Bld 111 (*)    Total Protein 9.1 (*)    Anion gap 17 (*)    All other components within normal limits  URINALYSIS, ROUTINE W REFLEX MICROSCOPIC - Abnormal; Notable for the following:    Hgb urine dipstick TRACE (*)    All other components within normal limits  SALICYLATE LEVEL - Abnormal; Notable for the following:    Salicylate Lvl <2.0 (*)    All other components within normal limits  URINE RAPID DRUG SCREEN (HOSP PERFORMED) - Abnormal; Notable for the following:    Tetrahydrocannabinol POSITIVE (*)    All other components within normal limits  URINE MICROSCOPIC-ADD ON - Abnormal; Notable for the following:    Squamous Epithelial / LPF FEW (*)    Casts HYALINE CASTS (*)    All other components within normal limits  PREGNANCY, URINE  ACETAMINOPHEN LEVEL  ETHANOL  ACETAMINOPHEN LEVEL    Imaging Review No results found.   EKG Interpretation   Date/Time:  Wednesday October 06 2014 00:48:43 EDT Ventricular Rate:  107 PR Interval:  190 QRS Duration: 89 QT Interval:  371 QTC Calculation: 495 R Axis:   79 Text Interpretation:  Sinus tachycardia Probable left atrial enlargement  Borderline repolarization abnormality Borderline prolonged QT interval ED  PHYSICIAN INTERPRETATION AVAILABLE IN [facility] Confirmed by TEST,  Record ([id number]) on 10/08/2014 7:21:05 AM      MDM   Final diagnoses:  Adjustment disorder with depressed mood   RN discussed with poison control. Recommend 6 hours observation postingestion. Labs within normal limits. Anticipate medical clearance and psychiatric evaluation. Observed emergency department without any acute events. Cleared for psychiatric evaluation.  Loren Raceravid Zenya Hickam, MD 10/08/14 [phone]

## 2014-10-06 NOTE — ED Notes (Signed)
Spoke with Patty with [facility]. Provided recent set of vital signs, Acetaminophen level, and update on patients status. Pt has been cleared from [facility].

## 2014-10-06 NOTE — Consult Note (Signed)
[facility] Consult   Reason for Consult: "I overdosed on my medications to kill myself'' Referring Physician:  EDP Shawneequa Ducey is an 18 y.o. female. Total Time spent with patient: 45 minutes  Assessment: AXIS I:  Adjustment Disorder with Depressed Mood AXIS II:  Deferred AXIS III:   Past Medical History  Diagnosis Date  . Chronic constipation   . Allergy    AXIS IV:  other psychosocial or environmental problems and problems related to social environment AXIS V:  51-60 moderate symptoms  Plan:  No evidence of imminent risk to self or others at present.   Patient will be referred to an outpatient therapist  Subjective:   Kevina Bosque is a 18 y.o. female patient admitted with depression and suicide attempt by overdosing.  HPI: Patient who denies previous history of mental illness. Patient reports that she overdosed on Benadryl 25 mg quanity 15 and Mucinex (6) after an arguments with a female friend. She stated that her intention yesterday was to end it all but today patient reports that her action was "dump". She reports feeling overwhelmed, depressed and stressed out since she started working and going to school at the same time. Today, patient denies suicidal/homicidal thoughts, psychosis or delusional thinking. She reports that her mother is supportive and she work as a Engineer, civil (consulting). Patient denies drugs and alcohol abuse but her urine toxicology is positive for Marijuana.   HPI Elements:   Location:  depressed. Timing:  1 day. Context:  argument with a female friend.  Past Psychiatric History: Past Medical History  Diagnosis Date  . Chronic constipation   . Allergy     reports that she has been smoking.  She has never used smokeless tobacco. She reports that she drinks about 1.2 ounces of alcohol per week. She reports that she does not use illicit drugs. Family History  Problem Relation Age of Onset  . Hirschsprung's disease Neg Hx    . Hypertension Mother   . Hypertension Father            Allergies:   Allergies  Allergen Reactions  . Food Hives and Rash    Bananas and Apples    ACT Assessment Complete:  Yes:    Educational Status    Risk to Self: Risk to self with the past 6 months Is patient at risk for suicide?: Yes Substance abuse history and/or treatment for substance abuse?: No  Risk to Others:    Abuse:    Prior Inpatient Therapy:    Prior Outpatient Therapy:    Additional Information:          Objective: Blood pressure 119/69, pulse 93, temperature 98 F (36.7 C), temperature source Oral, resp. rate 18, SpO2 98.00%.There is no weight on file to calculate BMI. Results for orders placed during the hospital encounter of 10/06/14 (from the past 72 hour(s))  CBC WITH DIFFERENTIAL     Status: Abnormal   Collection Time    10/06/14  1:11 AM      Result Value Ref Range   WBC 18.3 (*) 4.0 - 10.5 K/uL   RBC 4.33  3.87 - 5.11 MIL/uL   Hemoglobin 12.4  12.0 - 15.0 g/dL   HCT 36.1  36.0 - 46.0 %   MCV 83.4  78.0 - 100.0 fL   MCH 28.6  26.0 - 34.0 pg   MCHC 34.3  30.0 - 36.0 g/dL   RDW 13.6  11.5 - 15.5 %   Platelets 354  150 - 400 K/uL   Neutrophils Relative % 85 (*) 43 - 77 %   Neutro Abs 15.5 (*) 1.7 - 7.7 K/uL   Lymphocytes Relative 12  12 - 46 %   Lymphs Abs 2.2  0.7 - 4.0 K/uL   Monocytes Relative 3  3 - 12 %   Monocytes Absolute 0.5  0.1 - 1.0 K/uL   Eosinophils Relative 0  0 - 5 %   Eosinophils Absolute 0.0  0.0 - 0.7 K/uL   Basophils Relative 0  0 - 1 %   Basophils Absolute 0.0  0.0 - 0.1 K/uL  COMPREHENSIVE METABOLIC PANEL     Status: Abnormal   Collection Time    10/06/14  1:11 AM      Result Value Ref Range   Sodium 140  137 - 147 mEq/L   Potassium 3.4 (*) 3.7 - 5.3 mEq/L   Chloride 101  96 - 112 mEq/L   CO2 22  19 - 32 mEq/L   Glucose, Bld 111 (*) 70 - 99 mg/dL   BUN 7  6 - 23 mg/dL   Creatinine, Ser 0.75  0.50 - 1.10 mg/dL   Calcium 9.4  8.4 - 10.5 mg/dL   Total  Protein 9.1 (*) 6.0 - 8.3 g/dL   Albumin 4.3  3.5 - 5.2 g/dL   AST 18  0 - 37 U/L   ALT 8  0 - 35 U/L   Alkaline Phosphatase 60  39 - 117 U/L   Total Bilirubin 0.3  0.3 - 1.2 mg/dL   GFR calc non Af Amer >90  >90 mL/min   GFR calc Af Amer >90  >90 mL/min   Comment: (NOTE)     The eGFR has been calculated using the CKD EPI equation.     This calculation has not been validated in all clinical situations.     eGFR's persistently <90 mL/min signify possible Chronic Kidney     Disease.   Anion gap 17 (*) 5 - 15  ACETAMINOPHEN LEVEL     Status: None   Collection Time    10/06/14  1:11 AM      Result Value Ref Range   Acetaminophen (Tylenol), Serum <15.0  10 - 30 ug/mL   Comment:            THERAPEUTIC CONCENTRATIONS VARY     SIGNIFICANTLY. A RANGE OF 10-30     ug/mL MAY BE AN EFFECTIVE     CONCENTRATION FOR MANY PATIENTS.     HOWEVER, SOME ARE BEST TREATED     AT CONCENTRATIONS OUTSIDE THIS     RANGE.     ACETAMINOPHEN CONCENTRATIONS     >150 ug/mL AT 4 HOURS AFTER     INGESTION AND >50 ug/mL AT 12     HOURS AFTER INGESTION ARE     OFTEN ASSOCIATED WITH TOXIC     REACTIONS.  SALICYLATE LEVEL     Status: Abnormal   Collection Time    10/06/14  1:11 AM      Result Value Ref Range   Salicylate Lvl <6.7 (*) 2.8 - 20.0 mg/dL  ETHANOL     Status: None   Collection Time    10/06/14  1:11 AM      Result Value Ref Range   Alcohol, Ethyl (B) <11  0 - 11 mg/dL   Comment:            LOWEST DETECTABLE LIMIT FOR     SERUM ALCOHOL  IS 11 mg/dL     FOR MEDICAL PURPOSES ONLY  URINALYSIS, ROUTINE W REFLEX MICROSCOPIC     Status: Abnormal   Collection Time    10/06/14  1:50 AM      Result Value Ref Range   Color, Urine YELLOW  YELLOW   APPearance CLEAR  CLEAR   Specific Gravity, Urine 1.008  1.005 - 1.030   pH 6.0  5.0 - 8.0   Glucose, UA NEGATIVE  NEGATIVE mg/dL   Hgb urine dipstick TRACE (*) NEGATIVE   Bilirubin Urine NEGATIVE  NEGATIVE   Ketones, ur NEGATIVE  NEGATIVE mg/dL    Protein, ur NEGATIVE  NEGATIVE mg/dL   Urobilinogen, UA 0.2  0.0 - 1.0 mg/dL   Nitrite NEGATIVE  NEGATIVE   Leukocytes, UA NEGATIVE  NEGATIVE  PREGNANCY, URINE     Status: None   Collection Time    10/06/14  1:50 AM      Result Value Ref Range   Preg Test, Ur NEGATIVE  NEGATIVE   Comment:            THE SENSITIVITY OF THIS     METHODOLOGY IS >20 mIU/mL.  URINE RAPID DRUG SCREEN (HOSP PERFORMED)     Status: Abnormal   Collection Time    10/06/14  1:50 AM      Result Value Ref Range   Opiates NONE DETECTED  NONE DETECTED   Cocaine NONE DETECTED  NONE DETECTED   Benzodiazepines NONE DETECTED  NONE DETECTED   Amphetamines NONE DETECTED  NONE DETECTED   Tetrahydrocannabinol POSITIVE (*) NONE DETECTED   Barbiturates NONE DETECTED  NONE DETECTED   Comment:            DRUG SCREEN FOR MEDICAL PURPOSES     ONLY.  IF CONFIRMATION IS NEEDED     FOR ANY PURPOSE, NOTIFY LAB     WITHIN 5 DAYS.                LOWEST DETECTABLE LIMITS     FOR URINE DRUG SCREEN     Drug Class       Cutoff (ng/mL)     Amphetamine      1000     Barbiturate      200     Benzodiazepine   259     Tricyclics       563     Opiates          300     Cocaine          300     THC              50  URINE MICROSCOPIC-ADD ON     Status: Abnormal   Collection Time    10/06/14  1:50 AM      Result Value Ref Range   Squamous Epithelial / LPF FEW (*) RARE   WBC, UA 0-2  <3 WBC/hpf   RBC / HPF 3-6  <3 RBC/hpf   Bacteria, UA RARE  RARE   Casts HYALINE CASTS (*) NEGATIVE   Urine-Other MUCOUS PRESENT    ACETAMINOPHEN LEVEL     Status: None   Collection Time    10/06/14  3:25 AM      Result Value Ref Range   Acetaminophen (Tylenol), Serum <15.0  10 - 30 ug/mL   Comment:            THERAPEUTIC CONCENTRATIONS VARY     SIGNIFICANTLY. A RANGE OF 10-30  ug/mL MAY BE AN EFFECTIVE     CONCENTRATION FOR MANY PATIENTS.     HOWEVER, SOME ARE BEST TREATED     AT CONCENTRATIONS OUTSIDE THIS     RANGE.     ACETAMINOPHEN  CONCENTRATIONS     >150 ug/mL AT 4 HOURS AFTER     INGESTION AND >50 ug/mL AT 12     HOURS AFTER INGESTION ARE     OFTEN ASSOCIATED WITH TOXIC     REACTIONS.   Labs are reviewed and are pertinent for as above.  Current Facility-Administered Medications  Medication Dose Route Frequency Provider Last Rate Last Dose  . ondansetron (ZOFRAN) injection 4 mg  4 mg Intravenous Once Julianne Rice, MD       Current Outpatient Prescriptions  Medication Sig Dispense Refill  . diphenhydrAMINE (BENADRYL) 25 MG tablet Take 375 mg by mouth once.      Marland Kitchen guaiFENesin (MUCINEX) 600 MG 12 hr tablet Take 3,600 mg by mouth once.        Psychiatric Specialty Exam:     Blood pressure 119/69, pulse 93, temperature 98 F (36.7 C), temperature source Oral, resp. rate 18, SpO2 98.00%.There is no weight on file to calculate BMI.  General Appearance: Casual  Eye Contact::  Good  Speech:  Clear and Coherent  Volume:  Normal  Mood:  Euthymic  Affect:  Appropriate  Thought Process:  Negative  Orientation:  Full (Time, Place, and Person)  Thought Content:  Negative  Suicidal Thoughts:  No  Homicidal Thoughts:  No  Memory:  Immediate;   Fair Recent;   Fair Remote;   Fair  Judgement:  Fair  Insight:  Fair  Psychomotor Activity:  Normal  Concentration:  Fair  Recall:  [employer] of Knowledge:Fair  Language: Good  Akathisia:  No  Handed:  Right  AIMS (if indicated):     Assets:  Communication Skills Desire for Improvement Physical Health  Sleep:   good   Musculoskeletal: Strength & Muscle Tone: within normal limits Gait & Station: normal Patient leans: N/A  Treatment Plan Summary: Daily contact with patient to assess and evaluate symptoms and progress in treatment Medication management Patient will be discharged home with services  Corena Pilgrim, MD 10/06/2014 9:56 AM

## 2015-12-22 ENCOUNTER — Inpatient Hospital Stay (HOSPITAL_COMMUNITY)
Admission: AD | Admit: 2015-12-22 | Discharge: 2015-12-22 | Disposition: A | Discharge status: Home / Self Care | Payer: Self-pay | Source: Ambulatory Visit | Attending: Obstetrics & Gynecology | Admitting: Obstetrics & Gynecology

## 2015-12-22 ENCOUNTER — Encounter (HOSPITAL_COMMUNITY): Payer: Self-pay | Admitting: *Deleted

## 2015-12-22 DIAGNOSIS — O2341 Unspecified infection of urinary tract in pregnancy, first trimester: Secondary | ICD-10-CM

## 2015-12-22 DIAGNOSIS — Z3201 Encounter for pregnancy test, result positive: Secondary | ICD-10-CM | POA: Insufficient documentation

## 2015-12-22 DIAGNOSIS — R8290 Unspecified abnormal findings in urine: Secondary | ICD-10-CM | POA: Insufficient documentation

## 2015-12-22 DIAGNOSIS — Z3491 Encounter for supervision of normal pregnancy, unspecified, first trimester: Secondary | ICD-10-CM

## 2015-12-22 LAB — URINALYSIS, ROUTINE W REFLEX MICROSCOPIC
BILIRUBIN URINE: NEGATIVE
Glucose, UA: NEGATIVE mg/dL
HGB URINE DIPSTICK: NEGATIVE
KETONES UR: NEGATIVE mg/dL
Leukocytes, UA: NEGATIVE
Nitrite: POSITIVE — AB
PROTEIN: NEGATIVE mg/dL
Specific Gravity, Urine: 1.025 (ref 1.005–1.030)
pH: 7 (ref 5.0–8.0)

## 2015-12-22 LAB — URINE MICROSCOPIC-ADD ON
RBC / HPF: NONE SEEN RBC/hpf (ref 0–5)
WBC, UA: NONE SEEN WBC/hpf (ref 0–5)

## 2015-12-22 LAB — POCT PREGNANCY, URINE: Preg Test, Ur: POSITIVE — AB

## 2015-12-22 MED ORDER — NITROFURANTOIN MONOHYD MACRO 100 MG PO CAPS: 100.0000 mg | ORAL_CAPSULE | Freq: Two times a day (BID) | ORAL | Status: DC

## 2015-12-22 NOTE — MAU Provider Note (Signed)
Bailey Carlson is a 19 y.o. G1P0 at Unknown who presents to MAU today for pregnancy verification. The patient denies abdominal pain or vaginal bleeding today.   BP 129/83 mmHg  Pulse 98  Temp(Src) 98.2 F (36.8 C) (Oral)  Resp 20  Ht 5\' 2"  (1.575 m)  Wt 79.153 kg (174 lb 8 oz)  BMI 31.91 kg/m2  LMP 08/26/2015  CONSTITUTIONAL: Well-developed, well-nourished female in no acute distress.  ENT: External right and left ear normal.  EYES: EOM intact, conjunctivae normal.  MUSCULOSKELETAL: Normal range of motion.  CARDIOVASCULAR: Regular heart rate RESPIRATORY: Normal effort NEUROLOGICAL: Alert and oriented to person, place, and time.  SKIN: Skin is warm and dry. No rash noted. Not diaphoretic. No erythema. No pallor. PSYCH: Normal mood and affect. Normal behavior. Normal judgment and thought content.  Results for orders placed or performed during the hospital encounter of 12/22/15 (from the past 24 hour(s))  Urinalysis, Routine w reflex microscopic (not at [facility])     Status: Abnormal   Collection Time: 12/22/15  8:39 PM  Result Value Ref Range   Color, Urine YELLOW YELLOW   APPearance CLOUDY (A) CLEAR   Specific Gravity, Urine 1.025 1.005 - 1.030   pH 7.0 5.0 - 8.0   Glucose, UA NEGATIVE NEGATIVE mg/dL   Hgb urine dipstick NEGATIVE NEGATIVE   Bilirubin Urine NEGATIVE NEGATIVE   Ketones, ur NEGATIVE NEGATIVE mg/dL   Protein, ur NEGATIVE NEGATIVE mg/dL   Nitrite POSITIVE (A) NEGATIVE   Leukocytes, UA NEGATIVE NEGATIVE  Urine microscopic-add on     Status: Abnormal   Collection Time: 12/22/15  8:39 PM  Result Value Ref Range   Squamous Epithelial / LPF 6-30 (A) NONE SEEN   WBC, UA NONE SEEN 0 - 5 WBC/hpf   RBC / HPF NONE SEEN 0 - 5 RBC/hpf   Bacteria, UA MANY (A) NONE SEEN   Urine-Other AMORPHOUS URATES/PHOSPHATES   Pregnancy, urine POC     Status: Abnormal   Collection Time: 12/22/15  8:47 PM  Result Value Ref Range   Preg Test, Ur POSITIVE (A) NEGATIVE    A: Pos  pregnancy test Possible UTI  P: Discharge home Pregnancy confirmation letter and list of area OB providers given Patient advised to start taking prenatal vitamins First trimester warning signs reviewed Patient may return to MAU as needed or if her condition were to change or worsen  Rx Macrobid BID x 7d (urine to culture)  Arabella MerlesKimberly D Girolamo Lortie, CNM  12/22/2015 9:14 PM

## 2015-12-22 NOTE — MAU Note (Addendum)
PT  SAYS  SHE  DID  HPT   ON OCT- POSITIVE.   PLANS  TO GET  [facility]-  WITH  [facility].      PT WANTS   A VERIFICATION   LETTER.

## 2015-12-24 LAB — CULTURE, OB URINE

## 2016-10-26 ENCOUNTER — Encounter (HOSPITAL_COMMUNITY): Payer: Self-pay

## 2016-10-30 ENCOUNTER — Encounter (HOSPITAL_COMMUNITY): Payer: Self-pay | Admitting: Family Medicine

## 2016-10-30 ENCOUNTER — Emergency Department (HOSPITAL_COMMUNITY)
Admission: EM | Admit: 2016-10-30 | Discharge: 2016-10-30 | Disposition: A | Discharge status: Home / Self Care | Payer: Self-pay | Attending: Emergency Medicine | Admitting: Emergency Medicine

## 2016-10-30 DIAGNOSIS — R112 Nausea with vomiting, unspecified: Secondary | ICD-10-CM

## 2016-10-30 DIAGNOSIS — E860 Dehydration: Secondary | ICD-10-CM | POA: Insufficient documentation

## 2016-10-30 DIAGNOSIS — Z87891 Personal history of nicotine dependence: Secondary | ICD-10-CM | POA: Insufficient documentation

## 2016-10-30 DIAGNOSIS — Z3490 Encounter for supervision of normal pregnancy, unspecified, unspecified trimester: Secondary | ICD-10-CM

## 2016-10-30 LAB — COMPREHENSIVE METABOLIC PANEL
ALBUMIN: 4.2 g/dL (ref 3.5–5.0)
ALT: 9 U/L — ABNORMAL LOW (ref 14–54)
AST: 16 U/L (ref 15–41)
Alkaline Phosphatase: 45 U/L (ref 38–126)
Anion gap: 6 (ref 5–15)
BUN: 6 mg/dL (ref 6–20)
CHLORIDE: 103 mmol/L (ref 101–111)
CO2: 24 mmol/L (ref 22–32)
CREATININE: 0.8 mg/dL (ref 0.44–1.00)
Calcium: 9.2 mg/dL (ref 8.9–10.3)
GFR calc Af Amer: >60 mL/min (ref >60)
GFR calc non Af Amer: >60 mL/min (ref >60)
GLUCOSE: 106 mg/dL — AB (ref 65–99)
POTASSIUM: 3.7 mmol/L (ref 3.5–5.1)
Sodium: 133 mmol/L — ABNORMAL LOW (ref 135–145)
Total Bilirubin: 0.6 mg/dL (ref 0.3–1.2)
Total Protein: 8.5 g/dL — ABNORMAL HIGH (ref 6.5–8.1)

## 2016-10-30 LAB — URINALYSIS, ROUTINE W REFLEX MICROSCOPIC
BILIRUBIN URINE: NEGATIVE
GLUCOSE, UA: NEGATIVE mg/dL
HGB URINE DIPSTICK: NEGATIVE
KETONES UR: 40 mg/dL — AB
LEUKOCYTES UA: NEGATIVE
Nitrite: NEGATIVE
PROTEIN: NEGATIVE mg/dL
Specific Gravity, Urine: 1.035 — ABNORMAL HIGH (ref 1.005–1.030)
pH: 6 (ref 5.0–8.0)

## 2016-10-30 LAB — CBC
HEMATOCRIT: 35.9 % — AB (ref 36.0–46.0)
Hemoglobin: 12.5 g/dL (ref 12.0–15.0)
MCH: 29.2 pg (ref 26.0–34.0)
MCHC: 34.8 g/dL (ref 30.0–36.0)
MCV: 83.9 fL (ref 78.0–100.0)
PLATELETS: 364 10*3/uL (ref 150–400)
RBC: 4.28 MIL/uL (ref 3.87–5.11)
RDW: 13.1 % (ref 11.5–15.5)
WBC: 14.8 10*3/uL — AB (ref 4.0–10.5)

## 2016-10-30 LAB — PREGNANCY, URINE: Preg Test, Ur: POSITIVE — AB

## 2016-10-30 LAB — LIPASE, BLOOD: LIPASE: 17 U/L (ref 11–51)

## 2016-10-30 MED ORDER — ONDANSETRON 8 MG PO TBDP: 8.0000 mg | ORAL_TABLET | Freq: Three times a day (TID) | ORAL | 0 refills | Status: DC | PRN

## 2016-10-30 MED ORDER — ONDANSETRON 8 MG PO TBDP
8.0000 mg | ORAL_TABLET | Freq: Once | ORAL | Status: AC
Start: 2016-10-30 — End: 2016-10-30
  Administered 2016-10-30: 8 mg via ORAL
  Filled 2016-10-30: qty 1

## 2016-10-30 MED ORDER — ONDANSETRON HCL 4 MG/2ML IJ SOLN
4.0000 mg | Freq: Once | INTRAMUSCULAR | Status: AC
  Administered 2016-10-30: 4 mg via INTRAVENOUS
  Filled 2016-10-30: qty 2

## 2016-10-30 MED ORDER — SODIUM CHLORIDE 0.9 % IV BOLUS (SEPSIS)
1000.0000 mL | Freq: Once | INTRAVENOUS | Status: AC
  Administered 2016-10-30: 1000 mL via INTRAVENOUS

## 2016-10-30 NOTE — ED Provider Notes (Signed)
[facility]-EMERGENCY DEPT Provider Note   CSN: [id number] Arrival date & time: 10/30/16  0005  By signing my name below, I, Emmanuella Mensah, attest that this documentation has been prepared under the direction and in the presence of Zadie Rhine, MD. Electronically Signed: Angelene Giovanni, ED Scribe. 10/30/16. 2:41 AM.   History   Chief Complaint Chief Complaint  Patient presents with  . Emesis  . Back Pain    HPI Comments: Bailey Carlson is a 20 y.o. female with a hx of chronic constipation who presents to the Emergency Department complaining of multiple episodes of moderate non-bloody vomiting and non-bloody diarrhea onset one week ago. She notes that she has more episodes of vomiting than diarrhea. She reports associated lower back pain and mild abdominal cramps. She states that she took a home pregnancy exam yesterday and it was positive. No alleviating factors noted. Pt has not tried any medications PTA. She has NKDA. She denies any sick contacts. She also denies any fever, chills, cough, bowel/bladder incontinence, numbness/weakness in BLE, or any other symptoms.   The history is provided by the patient. No language interpreter was used.  Emesis   This is a new problem. The current episode started more than 2 days ago. The problem occurs 2 to 4 times per day. The problem has been gradually worsening. The emesis has an appearance of stomach contents. There has been no fever. Associated symptoms include abdominal pain and diarrhea. Pertinent negatives include no chills, no cough and no fever.  Back Pain   This is a new problem. The current episode started more than 2 days ago. The problem has been gradually worsening. The pain is associated with no known injury. The pain is present in the lumbar spine. The pain does not radiate. The pain is moderate. The pain is the same all the time. Associated symptoms include abdominal pain. Pertinent negatives include no fever, no bowel  incontinence, no bladder incontinence, no paresthesias and no weakness. She has tried nothing for the symptoms. Risk factors include pregnancy.    Past Medical History:  Diagnosis Date  . Allergy   . Chronic constipation     Patient Active Problem List   Diagnosis Date Noted  . Adjustment disorder with depressed mood 10/06/2014  . Chronic constipation     Past Surgical History:  Procedure Laterality Date  . NO PAST SURGERIES      OB History    Gravida Para Term Preterm AB Living   3             SAB TAB Ectopic Multiple Live Births                   Home Medications    Prior to Admission medications   Not on File    Family History Family History  Problem Relation Age of Onset  . Hypertension Mother   . Hypertension Father   . Hirschsprung's disease Neg Hx     Social History Social History  Substance Use Topics  . Smoking status: Former Smoker    Packs/day: 0.25    Types: Cigarettes  . Smokeless tobacco: Never Used  . Alcohol use No     Allergies   Food   Review of Systems Review of Systems  Constitutional: Negative for chills and fever.  Respiratory: Negative for cough.   Gastrointestinal: Positive for abdominal pain, diarrhea, nausea and vomiting. Negative for bowel incontinence.  Genitourinary: Negative for bladder incontinence and vaginal bleeding.  Musculoskeletal: Positive for back  pain.  Neurological: Negative for weakness and paresthesias.  All other systems reviewed and are negative.    Physical Exam Updated Vital Signs BP 130/95 (BP Location: Left Arm)   Pulse 90   Temp 98.2 F (36.8 C) (Oral)   Resp 18   Ht 5\' 2"  (1.575 m)   Wt 175 lb (79.4 kg)   LMP 08/26/2015   SpO2 100%   BMI 32.01 kg/m   Physical Exam  Nursing note and vitals reviewed.  CONSTITUTIONAL: Well developed/well nourished HEAD: Normocephalic/atraumatic EYES: EOMI/PERRL ENMT: Mucous membranes dry NECK: supple no meningeal signs SPINE/BACK:entire spine  non tender; diffuses paraspinal tenderness  CV: S1/S2 noted, no murmurs/rubs/gallops noted LUNGS: Lungs are clear to auscultation bilaterally, no apparent distress ABDOMEN: soft, nontender, no rebound or guarding, bowel sounds noted throughout abdomen GU:no cva tenderness NEURO: Pt is awake/alert/appropriate, moves all extremitiesx4.  No facial droop.   EXTREMITIES: pulses normal/equal, full ROM SKIN: warm, color normal PSYCH: no abnormalities of mood noted, alert and oriented to situation   ED Treatments / Results  DIAGNOSTIC STUDIES: Oxygen Saturation is 100% on RA, normal by my interpretation.    COORDINATION OF CARE: 2:36 AM- Pt advised of plan for treatment and pt agrees. Pt will receive lab work for further evaluation. She will also receive Zofran ODT.    Labs (all labs ordered are listed, but only abnormal results are displayed) Labs Reviewed  COMPREHENSIVE METABOLIC PANEL - Abnormal; Notable for the following:       Result Value   Sodium 133 (*)    Glucose, Bld 106 (*)    Total Protein 8.5 (*)    ALT 9 (*)    All other components within normal limits  CBC - Abnormal; Notable for the following:    WBC 14.8 (*)    HCT 35.9 (*)    All other components within normal limits  URINALYSIS, ROUTINE W REFLEX MICROSCOPIC (NOT AT [facility]) - Abnormal; Notable for the following:    Color, Urine AMBER (*)    APPearance CLOUDY (*)    Specific Gravity, Urine 1.035 (*)    Ketones, ur 40 (*)    All other components within normal limits  PREGNANCY, URINE - Abnormal; Notable for the following:    Preg Test, Ur POSITIVE (*)    All other components within normal limits  LIPASE, BLOOD    EKG  EKG Interpretation None       Radiology No results found.  Procedures Procedures (including critical care time)  Medications Ordered in ED Medications  ondansetron (ZOFRAN-ODT) disintegrating tablet 8 mg (8 mg Oral Given 10/30/16 0243)  sodium chloride 0.9 % bolus 1,000 mL (0 mLs  Intravenous Stopped 10/30/16 0549)  ondansetron (ZOFRAN) injection 4 mg (4 mg Intravenous Given 10/30/16 0404)     Initial Impression / Assessment and Plan / ED Course  Zadie Rhineonald Meygan Kyser, MD has reviewed the triage vital signs and the nursing notes.  Pertinent labs results that were available during my care of the patient were reviewed by me and considered in my medical decision making (see chart for details).  Clinical Course     Pt is pregnant with recent vomiting She now denies abd pain No vag bleeding reported She feels improved She is requesting zofran at discharge - advised this is not recommended in early pregnancy (LMP = September) but she would still like to try it Suspect hyperemesis at this time   Pt without focal abd tenderness/vaginal bleeding, I have low suspicion for ectopic pregnancy  or other acute OB emergency  Final Clinical Impressions(s) / ED Diagnoses   Final diagnoses:  Non-intractable vomiting with nausea, unspecified vomiting type  Dehydration  Pregnancy, unspecified gestational age    New Prescriptions Discharge Medication List as of 10/30/2016  5:37 AM    START taking these medications   Details  ondansetron (ZOFRAN ODT) 8 MG disintegrating tablet Take 1 tablet (8 mg total) by mouth every 8 (eight) hours as needed., Starting Tue 10/30/2016, Print       I personally performed the services described in this documentation, which was scribed in my presence. The recorded information has been reviewed and is accurate.        Zadie Rhineonald Genoveva Singleton, MD 10/30/16 [phone]

## 2016-10-30 NOTE — ED Triage Notes (Addendum)
Patient reports she is experiencing nausea, diarrhea with no abd pain and lower back pain. Denies fever or recent injury. Pt reports she took a home pregnancy test on Monday and it was positive.

## 2016-11-06 ENCOUNTER — Inpatient Hospital Stay (HOSPITAL_COMMUNITY): Payer: Self-pay

## 2016-11-06 ENCOUNTER — Encounter (HOSPITAL_COMMUNITY): Payer: Self-pay | Admitting: *Deleted

## 2016-11-06 ENCOUNTER — Inpatient Hospital Stay (HOSPITAL_COMMUNITY)
Admission: AD | Admit: 2016-11-06 | Discharge: 2016-11-06 | Disposition: A | Discharge status: Home / Self Care | Payer: Self-pay | Source: Ambulatory Visit | Attending: Obstetrics and Gynecology | Admitting: Obstetrics and Gynecology

## 2016-11-06 DIAGNOSIS — B9689 Other specified bacterial agents as the cause of diseases classified elsewhere: Secondary | ICD-10-CM | POA: Insufficient documentation

## 2016-11-06 DIAGNOSIS — Z87891 Personal history of nicotine dependence: Secondary | ICD-10-CM | POA: Insufficient documentation

## 2016-11-06 DIAGNOSIS — R1032 Left lower quadrant pain: Secondary | ICD-10-CM | POA: Insufficient documentation

## 2016-11-06 DIAGNOSIS — O219 Vomiting of pregnancy, unspecified: Secondary | ICD-10-CM

## 2016-11-06 DIAGNOSIS — R109 Unspecified abdominal pain: Secondary | ICD-10-CM

## 2016-11-06 DIAGNOSIS — Z3A01 Less than 8 weeks gestation of pregnancy: Secondary | ICD-10-CM | POA: Insufficient documentation

## 2016-11-06 DIAGNOSIS — N760 Acute vaginitis: Secondary | ICD-10-CM

## 2016-11-06 DIAGNOSIS — O26899 Other specified pregnancy related conditions, unspecified trimester: Secondary | ICD-10-CM

## 2016-11-06 DIAGNOSIS — O211 Hyperemesis gravidarum with metabolic disturbance: Secondary | ICD-10-CM | POA: Insufficient documentation

## 2016-11-06 DIAGNOSIS — O23591 Infection of other part of genital tract in pregnancy, first trimester: Secondary | ICD-10-CM | POA: Insufficient documentation

## 2016-11-06 LAB — URINALYSIS, ROUTINE W REFLEX MICROSCOPIC
BILIRUBIN URINE: NEGATIVE
Glucose, UA: NEGATIVE mg/dL
Hgb urine dipstick: NEGATIVE
Leukocytes, UA: NEGATIVE
Nitrite: NEGATIVE
Protein, ur: NEGATIVE mg/dL
pH: 6 (ref 5.0–8.0)

## 2016-11-06 LAB — WET PREP, GENITAL
Sperm: NONE SEEN
Trich, Wet Prep: NONE SEEN
Yeast Wet Prep HPF POC: NONE SEEN

## 2016-11-06 LAB — CBC
HCT: 37.7 % (ref 36.0–46.0)
Hemoglobin: 13.5 g/dL (ref 12.0–15.0)
MCH: 29.4 pg (ref 26.0–34.0)
MCHC: 35.8 g/dL (ref 30.0–36.0)
MCV: 82.1 fL (ref 78.0–100.0)
PLATELETS: 339 10*3/uL (ref 150–400)
RBC: 4.59 MIL/uL (ref 3.87–5.11)
RDW: 13.1 % (ref 11.5–15.5)
WBC: 14.6 10*3/uL — ABNORMAL HIGH (ref 4.0–10.5)

## 2016-11-06 LAB — HCG, QUANTITATIVE, PREGNANCY: HCG, BETA CHAIN, QUANT, S: 133888 m[IU]/mL — AB (ref <5)

## 2016-11-06 MED ORDER — PROMETHAZINE HCL 25 MG PO TABS: 25.0000 mg | ORAL_TABLET | Freq: Four times a day (QID) | ORAL | 2 refills | Status: DC | PRN

## 2016-11-06 MED ORDER — METRONIDAZOLE 500 MG PO TABS: 500.0000 mg | ORAL_TABLET | Freq: Two times a day (BID) | ORAL | 0 refills | Status: DC

## 2016-11-06 MED ORDER — M.V.I. ADULT IV INJ
Freq: Once | INTRAVENOUS | Status: AC
  Administered 2016-11-06: 21:00:00 via INTRAVENOUS
  Filled 2016-11-06: qty 1000

## 2016-11-06 MED ORDER — CONCEPT OB 130-92.4-1 MG PO CAPS: 1.0000 | ORAL_CAPSULE | Freq: Every day | ORAL | 12 refills | Status: DC

## 2016-11-06 MED ORDER — DEXTROSE IN LACTATED RINGERS 5 % IV SOLN
Freq: Once | INTRAVENOUS | Status: AC
  Administered 2016-11-06: 20:00:00 via INTRAVENOUS
  Filled 2016-11-06: qty 1000

## 2016-11-06 MED ORDER — M.V.I. ADULT IV INJ: Freq: Once | INTRAVENOUS | Status: DC

## 2016-11-06 NOTE — Discharge Instructions (Signed)
Prenatal Care WHAT IS PRENATAL CARE? Prenatal care is the process of caring for a pregnant woman before she gives birth. Prenatal care makes sure that she and her baby remain as healthy as possible throughout pregnancy. Prenatal care may be provided by a midwife, family practice health care provider, or a childbirth and pregnancy specialist (obstetrician). Prenatal care may include physical examinations, testing, treatments, and education on nutrition, lifestyle, and social support services. WHY IS PRENATAL CARE SO IMPORTANT? Early and consistent prenatal care increases the chance that you and your baby will remain healthy throughout your pregnancy. This type of care also decreases a babys risk of being born too early (prematurely), or being born smaller than expected (small for gestational age). Any underlying medical conditions you may have that could pose a risk during your pregnancy are discussed during prenatal care visits. You will also be monitored regularly for any new conditions that may arise during your pregnancy so they can be treated quickly and effectively. WHAT HAPPENS DURING PRENATAL CARE VISITS? Prenatal care visits may include the following: Discussion Tell your health care provider about any new signs or symptoms you have experienced since your last visit. These might include:  Nausea or vomiting.  Increased or decreased level of energy.  Difficulty sleeping.  Back or leg pain.  Weight changes.  Frequent urination.  Shortness of breath with physical activity.  Changes in your skin, such as the development of a rash or itchiness.  Vaginal discharge or bleeding.  Feelings of excitement or nervousness.  Changes in your babys movements. You may want to write down any questions or topics you want to discuss with your health care provider and bring them with you to your appointment. Examination During your first prenatal care visit, you will likely have a complete  physical exam. Your health care provider will often examine your vagina, cervix, and the position of your uterus, as well as check your heart, lungs, and other body systems. As your pregnancy progresses, your health care provider will measure the size of your uterus and your babys position inside your uterus. He or she may also examine you for early signs of labor. Your prenatal visits may also include checking your blood pressure and, after about 10-12 weeks of pregnancy, listening to your babys heartbeat. Testing Regular testing often includes:  Urinalysis. This checks your urine for glucose, protein, or signs of infection.  Blood count. This checks the levels of white and red blood cells in your body.  Tests for sexually transmitted infections (STIs). Testing for STIs at the beginning of pregnancy is routinely done and is required in many states.  Antibody testing. You will be checked to see if you are immune to certain illnesses, such as rubella, that can affect a developing fetus.  Glucose screen. Around 24-28 weeks of pregnancy, your blood glucose level will be checked for signs of gestational diabetes. Follow-up tests may be recommended.  Group B strep. This is a bacteria that is commonly found inside a womans vagina. This test will inform your health care provider if you need an antibiotic to reduce the amount of this bacteria in your body prior to labor and childbirth.  Ultrasound. Many pregnant women undergo an ultrasound screening around 18-20 weeks of pregnancy to evaluate the health of the fetus and check for any developmental abnormalities.  HIV (human immunodeficiency virus) testing. Early in your pregnancy, you will be screened for HIV. If you are at high risk for HIV, this test may be  repeated during your third trimester of pregnancy. You may be offered other testing based on your age, personal or family medical history, or other factors. HOW OFTEN SHOULD I PLAN TO SEE MY  HEALTH CARE PROVIDER FOR PRENATAL CARE? Your prenatal care check-up schedule depends on any medical conditions you have before, or develop during, your pregnancy. If you do not have any underlying medical conditions, you will likely be seen for checkups:  Monthly, during the first 6 months of pregnancy.  Twice a month during months 7 and 8 of pregnancy.  Weekly starting in the 9th month of pregnancy and until delivery. If you develop signs of early labor or other concerning signs or symptoms, you may need to see your health care provider more often. Ask your health care provider what prenatal care schedule is best for you. WHAT CAN I DO TO KEEP MYSELF AND MY BABY AS HEALTHY AS POSSIBLE DURING MY PREGNANCY?  Take a prenatal vitamin containing 400 micrograms (0.4 mg) of folic acid every day. Your health care provider may also ask you to take additional vitamins such as iodine, vitamin D, iron, copper, and zinc.  Take 1500-2000 mg of calcium daily starting at your 20th week of pregnancy until you deliver your baby.  Make sure you are up to date on your vaccinations. Unless directed otherwise by your health care provider:  You should receive a tetanus, diphtheria, and pertussis (Tdap) vaccination between the 27th and 36th week of your pregnancy, regardless of when your last Tdap immunization occurred. This helps protect your baby from whooping cough (pertussis) after he or she is born.  You should receive an annual inactivated influenza vaccine (IIV) to help protect you and your baby from influenza. This can be done at any point during your pregnancy.  Eat a well-rounded diet that includes:  Fresh fruits and vegetables.  Lean proteins.  Calcium-rich foods such as milk, yogurt, hard cheeses, and dark, leafy greens.  Whole grain breads.  Do noteat seafood high in mercury, including:  Swordfish.  Tilefish.  Shark.  King mackerel.  More than 6 oz tuna per week.  Do not eat:  Raw  or undercooked meats or eggs.  Unpasteurized foods, such as soft cheeses (brie, blue, or feta), juices, and milks.  Lunch meats.  Hot dogs that have not been heated until they are steaming.  Drink enough water to keep your urine clear or pale yellow. For many women, this may be 10 or more 8 oz glasses of water each day. Keeping yourself hydrated helps deliver nutrients to your baby and may prevent the start of pre-term uterine contractions.  Do not use any tobacco products including cigarettes, chewing tobacco, or electronic cigarettes. If you need help quitting, ask your health care provider.  Do not drink beverages containing alcohol. No safe level of alcohol consumption during pregnancy has been determined.  Do not use any illegal drugs. These can harm your developing baby or cause a miscarriage.  Ask your health care provider or pharmacist before taking any prescription or over-the-counter medicines, herbs, or supplements.  Limit your caffeine intake to no more than 200 mg per day.  Exercise. Unless told otherwise by your health care provider, try to get 30 minutes of moderate exercise most days of the week. Do not  do high-impact activities, contact sports, or activities with a high risk of falling, such as horseback riding or downhill skiing.  Get plenty of rest.  Avoid anything that raises your body temperature, such  as hot tubs and saunas.  If you own a cat, do not empty its litter box. Bacteria contained in cat feces can cause an infection called toxoplasmosis. This can result in serious harm to the fetus.  Stay away from chemicals such as insecticides, lead, mercury, and cleaning or paint products that contain solvents.  Do not have any X-rays taken unless medically necessary.  Take a childbirth and breastfeeding preparation class. Ask your health care provider if you need a referral or recommendation. This information is not intended to replace advice given to you by your  health care provider. Make sure you discuss any questions you have with your health care provider. Document Released: 12/13/2003 Document Revised: 05/14/2016 Document Reviewed: 02/24/2014 Elsevier Interactive Patient Education  2017 [employer]   Morning Sickness Morning sickness is when you feel sick to your stomach (nauseous) during pregnancy. This nauseous feeling may or may not come with vomiting. It often occurs in the morning but can be a problem any time of day. Morning sickness is most common during the first trimester, but it may continue throughout pregnancy. While morning sickness is unpleasant, it is usually harmless unless you develop severe and continual vomiting (hyperemesis gravidarum). This condition requires more intense treatment. What are the causes? The cause of morning sickness is not completely known but seems to be related to normal hormonal changes that occur in pregnancy. What increases the risk? You are at greater risk if you:  Experienced nausea or vomiting before your pregnancy.  Had morning sickness during a previous pregnancy.  Are pregnant with more than one baby, such as twins. How is this treated? Do not use any medicines (prescription, over-the-counter, or herbal) for morning sickness without first talking to your health care provider. Your health care provider may prescribe or recommend:  Vitamin B6 supplements.  Anti-nausea medicines.  The herbal medicine ginger. Follow these instructions at home:  Only take over-the-counter or prescription medicines as directed by your health care provider.  Taking multivitamins before getting pregnant can prevent or decrease the severity of morning sickness in most women.  Eat a piece of dry toast or unsalted crackers before getting out of bed in the morning.  Eat five or six small meals a day.  Eat dry and bland foods (rice, baked potato). Foods high in carbohydrates are often helpful.  Do not drink  liquids with your meals. Drink liquids between meals.  Avoid greasy, fatty, and spicy foods.  Get someone to cook for you if the smell of any food causes nausea and vomiting.  If you feel nauseous after taking prenatal vitamins, take the vitamins at night or with a snack.  Snack on protein foods (nuts, yogurt, cheese) between meals if you are hungry.  Eat unsweetened gelatins for desserts.  Wearing an acupressure wristband (worn for sea sickness) may be helpful.  Acupuncture may be helpful.  Do not smoke.  Get a humidifier to keep the air in your house free of odors.  Get plenty of fresh air. Contact a health care provider if:  Your home remedies are not working, and you need medicine.  You feel dizzy or lightheaded.  You are losing weight. Get help right away if:  You have persistent and uncontrolled nausea and vomiting.  You pass out (faint). This information is not intended to replace advice given to you by your health care provider. Make sure you discuss any questions you have with your health care provider. Document Released: 01/31/2007 Document Revised: 05/17/2016 Document  Reviewed: 05/27/2013 Elsevier Interactive Patient Education  2017 [employer].   Eating Plan for Hyperemesis Gravidarum Hyperemesis gravidarum is a severe form of morning sickness. Because this condition causes severe nausea and vomiting, it can lead to dehydration, malnutrition, and weight loss. One way to lessen the symptoms of nausea and vomiting is to follow the eating plan for hyperemesis gravidarum. It is often used along with prescribed medicines to control your symptoms. What can I do to relieve my symptoms? Listen to your body. Everyone is different and has different preferences. Find what works best for you. Take any of the following actions that are helpful to you:  Eat and drink slowly.  Eat 5-6 small meals daily instead of 3 large meals.  Eat crackers before you get out of bed in  the morning.  Try having a snack in the middle of the night.  Starchy foods are usually tolerated well. Examples include cereal, toast, bread, potatoes, pasta, rice, and pretzels.  Ginger may help with nausea. Add  tsp ground ginger to hot tea or choose ginger tea.  Try drinking 100% fruit juice or an electrolyte drink. An electrolyte drink contains sodium, potassium, and chloride.  Continue to take your prenatal vitamins as told by your health care provider. If you are having trouble taking your prenatal vitamins, talk with your health care provider about different options.  Include at least 1 serving of protein with your meals and snacks. Protein options include meats or poultry, beans, nuts, eggs, and yogurt. Try eating a protein-rich snack before bed. Examples of these snacks include cheese and crackers or half of a peanut butter or [place] sandwich.  Consider eliminating foods that trigger your symptoms. These may include spicy foods, coffee, high-fat foods, very sweet foods, and acidic foods.  Try meals that have more protein combined with bland, salty, lower-fat, and dry foods, such as nuts, seeds, pretzels, crackers, and cereal.  Talk with your healthcare provider about starting a supplement of vitamin B6.  Have fluids that are cold, clear, and carbonated or sour. Examples include lemonade, ginger ale, lemon-lime soda, ice water, and sparkling water.  Try lemon or mint tea.  Try brushing your teeth or using a mouth rinse after meals. What should I avoid to reduce my symptoms? Avoiding some of the following things may help reduce your symptoms.  Foods with strong smells. Try eating meals in well-ventilated areas that are free of odors.  Drinking water or other beverages with meals. Try not to drink anything during the 30 minutes before and after your meals.  Drinking more than 1 cup of fluid at a time. Sometimes using a straw helps.  Fried or high-fat foods, such as butter  and cream sauces.  Spicy foods.  Skipping meals as best as you can. Nausea can be more intense on an empty stomach. If you cannot tolerate food at that time, do not force it. Try sucking on ice chips or other frozen items, and make up for missed calories later.  Lying down within 2 hours after eating.  Environmental triggers. These may include smoky rooms, closed spaces, rooms with strong smells, warm or humid places, overly loud and noisy rooms, and rooms with motion or flickering lights.  Quick and sudden changes in your movement. This information is not intended to replace advice given to you by your health care provider. Make sure you discuss any questions you have with your health care provider. Document Released: 10/07/2007 Document Revised: 08/08/2016 Document Reviewed: 07/10/2016 Elsevier  Interactive Patient Education  2017 [employer].

## 2016-11-06 NOTE — MAU Note (Signed)
Been throwing up like literally every thing for 2 wks.  2 or 3 days ago, started having pain in LLQ.  When it hurts really bad, her left shoulder will hurt too

## 2016-11-06 NOTE — MAU Provider Note (Signed)
History     CSN: [id number]  Arrival date and time: 11/06/16 [id number]   First Provider Initiated Contact with Patient 11/06/16 1903      Chief Complaint  Patient presents with  . Abdominal Pain  . Emesis During Pregnancy   HPI  Pt is 20 y.o.G2P0010 2217w5d who presents with nausea and vomiting in pregnancy with LLQ pain. Pt states she has not been able to keep anything down since her [facility] 10/30/2016 (10 days ago).  Pt states she was given a dose of zofran while in the ED which worked and a presciption for a few other doses. The LLQ pain is constant- it feels better when she is sitting bent over. Pt denies any spotting or bleeding. Pt did not have a confirmed IUP at that visit. Pt has not taken anything for the pain or nausea/vomiting. From ED: This is a new problem. The current episode started more than 2 days ago. The problem occurs 2 to 4 times per day. The problem has been gradually worsening. The emesis has an appearance of stomach contents. There has been no fever. Associated symptoms include abdominal pain and diarrhea. Pertinent negatives include no chills, no cough and no fever. MAU Note Date of Service: 11/06/2016 6:44 PM Jolynn K Spurlock-Frizzell, RN    [] Hide copied text [] Hover for attribution information Been throwing up like literally every thing for 2 wks.  2 or 3 days ago, started having pain in LLQ.  When it hurts really bad, her left shoulder will hurt too      RN note: MAU Note Date of Service: 11/06/2016 6:44 PM Jolynn K Spurlock-Frizzell, RN    [] Hide copied text [] Hover for attribution information Been throwing up like literally every thing for 2 wks.  2 or 3 days ago, started having pain in LLQ.  When it hurts really bad, her left shoulder will hurt too      Past Medical History:  Diagnosis Date  . Allergy   . Chronic constipation     Past Surgical History:  Procedure Laterality Date  . NO PAST SURGERIES      Family History  Problem  Relation Age of Onset  . Hypertension Mother   . Hypertension Father   . Hirschsprung's disease Neg Hx     Social History  Substance Use Topics  . Smoking status: Former Smoker    Packs/day: 0.25    Types: Cigarettes  . Smokeless tobacco: Never Used  . Alcohol use No    Allergies:  Allergies  Allergen Reactions  . Food Hives and Rash    Bananas and Apples    Prescriptions Prior to Admission  Medication Sig Dispense Refill Last Dose  . ondansetron (ZOFRAN ODT) 8 MG disintegrating tablet Take 1 tablet (8 mg total) by mouth every 8 (eight) hours as needed. (Patient not taking: Reported on 11/06/2016) 5 tablet 0 Not Taking at Unknown time    Review of Systems  Constitutional: Positive for weight loss. Negative for chills and fever.  Gastrointestinal: Positive for abdominal pain, nausea and vomiting. Negative for constipation.  Genitourinary: Negative for dysuria and urgency.  Neurological: Positive for dizziness. Negative for headaches.   Physical Exam   Blood pressure 132/84, pulse 74, temperature 98.5 F (36.9 C), temperature source Oral, resp. rate 18, weight 165 lb 3.2 oz (74.9 kg), last menstrual period 09/06/2016, unknown if currently breastfeeding.  Physical Exam  Nursing note and vitals reviewed. Constitutional: She is oriented to person, place, and  time. She appears well-developed and well-nourished. No distress.  HENT:  Head: Normocephalic.  Eyes: Pupils are equal, round, and reactive to light.  Neck: Normal range of motion. Neck supple.  Cardiovascular: Normal rate.   Respiratory: Effort normal.  GI: Soft. She exhibits no distension. There is no tenderness. There is no rebound and no guarding.  Genitourinary:  Genitourinary Comments: Small amount of white discharge in vault; cervix closed, nontender; adnexa without palpable enlargement or tenderness; uterus soft ~week size NT  Musculoskeletal: Normal range of motion.  Neurological: She is alert and oriented  to person, place, and time.  Skin: Skin is warm and dry.  Psychiatric: She has a normal mood and affect.    MAU Course  Procedures Results for orders placed or performed during the hospital encounter of 11/06/16 (from the past 24 hour(s))  Urinalysis, Routine w reflex microscopic (not at [facility])     Status: Abnormal   Collection Time: 11/06/16  6:45 PM  Result Value Ref Range   Color, Urine YELLOW YELLOW   APPearance CLEAR CLEAR   Specific Gravity, Urine >1.030 (H) 1.005 - 1.030   pH 6.0 5.0 - 8.0   Glucose, UA NEGATIVE NEGATIVE mg/dL   Hgb urine dipstick NEGATIVE NEGATIVE   Bilirubin Urine NEGATIVE NEGATIVE   Ketones, ur >80 (A) NEGATIVE mg/dL   Protein, ur NEGATIVE NEGATIVE mg/dL   Nitrite NEGATIVE NEGATIVE   Leukocytes, UA NEGATIVE NEGATIVE  CBC     Status: Abnormal   Collection Time: 11/06/16  7:35 PM  Result Value Ref Range   WBC 14.6 (H) 4.0 - 10.5 K/uL   RBC 4.59 3.87 - 5.11 MIL/uL   Hemoglobin 13.5 12.0 - 15.0 g/dL   HCT 16.1 09.6 - 04.5 %   MCV 82.1 78.0 - 100.0 fL   MCH 29.4 26.0 - 34.0 pg   MCHC 35.8 30.0 - 36.0 g/dL   RDW 40.9 81.1 - 91.4 %   Platelets 339 150 - 400 K/uL  hCG, quantitative, pregnancy     Status: Abnormal   Collection Time: 11/06/16  7:35 PM  Result Value Ref Range   hCG, Beta Chain, Quant, S 133,888 (H) <5 mIU/mL  Wet prep, genital     Status: Abnormal   Collection Time: 11/06/16  8:02 PM  Result Value Ref Range   Yeast Wet Prep HPF POC NONE SEEN NONE SEEN   Trich, Wet Prep NONE SEEN NONE SEEN   Clue Cells Wet Prep HPF POC PRESENT (A) NONE SEEN   WBC, Wet Prep HPF POC MODERATE (A) NONE SEEN   Sperm NONE SEEN   GC/Chlamydia pending US Ob Comp Less 14 Wks  Result Date: 11/06/2016 CLINICAL DATA:  20 y/o  F; left lower quadrant pain for 2-3 days. EXAM: OBSTETRIC <14 WK [name] AND TRANSVAGINAL OB US TECHNIQUE: Both transabdominal and transvaginal ultrasound examinations were performed for complete evaluation of the gestation as well as the maternal  uterus, adnexal regions, and pelvic cul-de-sac. Transvaginal technique was performed to assess early pregnancy. COMPARISON:  None. FINDINGS: Intrauterine gestational sac: Single Yolk sac:  Visualized. Embryo:  Visualized. Cardiac Activity: Visualized. Heart Rate: 152  bpm CRL:  12.3  mm   7 w   3 d                  [name] EDC: 06/22/2017 Subchorionic hemorrhage: Subchorionic hypoechoic collection along the posterior wall compatible with a moderate subchorionic hemorrhage. Mild mass effect on the gestational sac. Maternal uterus/adnexae: Normal. IMPRESSION: 1. Single  live intrauterine pregnancy with estimated gestational age of [redacted] weeks 3 days by crown-rump length. 2. Moderate subchorionic hemorrhage. Electronically Signed   By: Mitzi HansenLance  Furusawa-Stratton M.D.   On: 11/06/2016 20:42   [place] Ob Transvaginal  Result Date: 11/06/2016 CLINICAL DATA:  20 y/o  F; left lower quadrant pain for 2-3 days. EXAM: OBSTETRIC <14 WK US AND TRANSVAGINAL OB US TECHNIQUE: Both transabdominal and transvaginal ultrasound examinations were performed for complete evaluation of the gestation as well as the maternal uterus, adnexal regions, and pelvic cul-de-sac. Transvaginal technique was performed to assess early pregnancy. COMPARISON:  None. FINDINGS: Intrauterine gestational sac: Single Yolk sac:  Visualized. Embryo:  Visualized. Cardiac Activity: Visualized. Heart Rate: 152  bpm CRL:  12.3  mm   7 w   3 d                  [place] EDC: 06/22/2017 Subchorionic hemorrhage: Subchorionic hypoechoic collection along the posterior wall compatible with a moderate subchorionic hemorrhage. Mild mass effect on the gestational sac. Maternal uterus/adnexae: Normal. IMPRESSION: 1. Single live intrauterine pregnancy with estimated gestational age of [redacted] weeks 3 days by crown-rump length. 2. Moderate subchorionic hemorrhage. Electronically Signed   By: Mitzi HansenLance  Furusawa-Stratton M.D.   On: 11/06/2016 20:42    Assessment and Plan  Abdominal pain in  pregnancy Viable IUP 3918w3d Hyperemesis with dehydration- Rx phenergan 25mg  BV- flagyl 500mg  BID for 7 days Care assumed by [place] Tamaiya Bump, CNM  LINEBERRY,SUSAN 11/06/2016, 9:03 PM   Patient tolerating crackers. Feels much better.  ASSESSMENT 1. Nausea and vomiting of pregnancy, antepartum   2. Abdominal pain in pregnancy   3. BV (bacterial vaginosis)    PLAN Discharge home in stable condition. Pregnancy verification letter given. List of providers given. Rx Phenergan, prenatal vitamin and Flagyl.   Medication List    STOP taking these medications   ondansetron 8 MG disintegrating tablet Commonly known as:  ZOFRAN ODT     TAKE these medications   CONCEPT OB 130-92.4-1 MG Caps Take 1 tablet by mouth daily.   metroNIDAZOLE 500 MG tablet Commonly known as:  FLAGYL Take 1 tablet (500 mg total) by mouth 2 (two) times daily.   promethazine 25 MG tablet Commonly known as:  PHENERGAN Take 1 tablet (25 mg total) by mouth every 6 (six) hours as needed.      [place] Manaia Samad, CNM 11/07/2016 1:18 AM

## 2016-11-07 LAB — HIV ANTIBODY (ROUTINE TESTING W REFLEX): HIV Screen 4th Generation wRfx: NONREACTIVE

## 2016-11-08 LAB — GC/CHLAMYDIA PROBE AMP (~~LOC~~) NOT AT ARMC
Chlamydia: NEGATIVE
NEISSERIA GONORRHEA: NEGATIVE

## 2016-12-24 NOTE — L&D Delivery Note (Addendum)
Delivery Note At 5:24 PM a viable female was delivered via Vaginal, Spontaneous Delivery (Presentation: ROA ;  ).  APGAR: 8, 9; weight pending .  Infant placed on maternal abdomen and stimulated; cord cut and clamped after 1 min.  Placenta status: Delivered intact with gentle traction; placenta intact.  Cord: 3 VC with the following complications: .  Cord pH: na  Anesthesia:  epidura Episiotomy: None Lacerations:  Left Periurethral repaired with a 2-0 vicryl; 2nd degree perineal repaired with a 2-0 vicryl ; small cervical lac repaired with 2-o vicryl Suture Repair: 2.0 vicryl Est. Blood Loss (mL):  300  Mom to postpartum.  Baby to Couplet care / Skin to Skin.  Bailey Carlson 06/28/2017, 6:17 PM

## 2016-12-27 LAB — OB RESULTS CONSOLE RUBELLA ANTIBODY, IGM: RUBELLA: IMMUNE

## 2016-12-27 LAB — OB RESULTS CONSOLE HEPATITIS B SURFACE ANTIGEN: Hepatitis B Surface Ag: NEGATIVE

## 2016-12-27 LAB — OB RESULTS CONSOLE RPR: RPR: NONREACTIVE

## 2016-12-27 LAB — OB RESULTS CONSOLE HIV ANTIBODY (ROUTINE TESTING): HIV: NONREACTIVE

## 2016-12-27 LAB — OB RESULTS CONSOLE ABO/RH: RH Type: POSITIVE

## 2016-12-27 LAB — OB RESULTS CONSOLE ANTIBODY SCREEN: Antibody Screen: NEGATIVE

## 2016-12-27 LAB — OB RESULTS CONSOLE GC/CHLAMYDIA
Chlamydia: NEGATIVE
GC PROBE AMP, GENITAL: NEGATIVE

## 2017-05-08 ENCOUNTER — Inpatient Hospital Stay (HOSPITAL_COMMUNITY)
Admission: AD | Admit: 2017-05-08 | Discharge: 2017-05-08 | Disposition: A | Discharge status: Home / Self Care | Payer: Medicaid Other | Source: Ambulatory Visit | Attending: Obstetrics and Gynecology | Admitting: Obstetrics and Gynecology

## 2017-05-08 ENCOUNTER — Encounter (HOSPITAL_COMMUNITY): Payer: Self-pay

## 2017-05-08 DIAGNOSIS — Z87891 Personal history of nicotine dependence: Secondary | ICD-10-CM | POA: Insufficient documentation

## 2017-05-08 DIAGNOSIS — O26893 Other specified pregnancy related conditions, third trimester: Secondary | ICD-10-CM | POA: Diagnosis not present

## 2017-05-08 DIAGNOSIS — Z3A33 33 weeks gestation of pregnancy: Secondary | ICD-10-CM | POA: Insufficient documentation

## 2017-05-08 DIAGNOSIS — O26899 Other specified pregnancy related conditions, unspecified trimester: Secondary | ICD-10-CM | POA: Diagnosis not present

## 2017-05-08 DIAGNOSIS — K5909 Other constipation: Secondary | ICD-10-CM | POA: Insufficient documentation

## 2017-05-08 DIAGNOSIS — Z3689 Encounter for other specified antenatal screening: Secondary | ICD-10-CM

## 2017-05-08 DIAGNOSIS — O99613 Diseases of the digestive system complicating pregnancy, third trimester: Secondary | ICD-10-CM | POA: Diagnosis not present

## 2017-05-08 DIAGNOSIS — R109 Unspecified abdominal pain: Secondary | ICD-10-CM | POA: Diagnosis not present

## 2017-05-08 LAB — WET PREP, GENITAL
Clue Cells Wet Prep HPF POC: NONE SEEN
SPERM: NONE SEEN
Trich, Wet Prep: NONE SEEN
Yeast Wet Prep HPF POC: NONE SEEN

## 2017-05-08 LAB — URINALYSIS, ROUTINE W REFLEX MICROSCOPIC
Bacteria, UA: NONE SEEN
Bilirubin Urine: NEGATIVE
Glucose, UA: NEGATIVE mg/dL
HGB URINE DIPSTICK: NEGATIVE
Ketones, ur: NEGATIVE mg/dL
NITRITE: NEGATIVE
PH: 9 — AB (ref 5.0–8.0)
Protein, ur: NEGATIVE mg/dL
SPECIFIC GRAVITY, URINE: 1.015 (ref 1.005–1.030)

## 2017-05-08 NOTE — Discharge Instructions (Signed)
Abdominal Pain During Pregnancy °Belly (abdominal) pain is common during pregnancy. Most of the time, it is not a serious problem. Other times, it can be a sign that something is wrong with the pregnancy. Always tell your doctor if you have belly pain. °Follow these instructions at home: °Monitor your belly pain for any changes. The following actions may help you feel better: °· Do not have sex (intercourse) or put anything in your vagina until you feel better. °· Rest until your pain stops. °· Drink clear fluids if you feel sick to your stomach (nauseous). Do not eat solid food until you feel better. °· Only take medicine as told by your doctor. °· Keep all doctor visits as told. °Get help right away if: °· You are bleeding, leaking fluid, or pieces of tissue come out of your vagina. °· You have more pain or cramping. °· You keep throwing up (vomiting). °· You have pain when you pee (urinate) or have blood in your pee. °· You have a fever. °· You do not feel your baby moving as much. °· You feel very weak or feel like passing out. °· You have trouble breathing, with or without belly pain. °· You have a very bad headache and belly pain. °· You have fluid leaking from your vagina and belly pain. °· You keep having watery poop (diarrhea). °· Your belly pain does not go away after resting, or the pain gets worse. °This information is not intended to replace advice given to you by your health care provider. Make sure you discuss any questions you have with your health care provider. °Document Released: 11/28/2009 Document Revised: 07/18/2016 Document Reviewed: 07/09/2013 °Elsevier Interactive Patient Education © 2017 [employer] Inc[employer] ° °

## 2017-05-08 NOTE — MAU Note (Signed)
Been having really bad cramps for going on 3 hrs. Feels like period cramps.  No bleeding or leaking.  Denies recent intercourse. Denies hx of PTL.   [facility]

## 2017-05-08 NOTE — MAU Provider Note (Signed)
History     CSN: [id number]  Arrival date and time: 05/08/17 1320   First Provider Initiated Contact with Patient 05/08/17 1401      Chief Complaint  Patient presents with  . Abdominal Cramping   G2P0010 @33 .4 wks here with lower abdominal cramping and back pain. Sx stated about 3-4 hrs ago. Pain starts in the back and wraps to the abdomen. She has not taken anything for it. She denies VB, LOF, or vaginal discharge. Reports good FM. Some polyuria over the last week but denies other urinary sx. Admits to poor hydration. Pregnancy has been uncomplicated and [facility]     OB History    Gravida Para Term Preterm AB Living   2       1     SAB TAB Ectopic Multiple Live Births     1            Past Medical History:  Diagnosis Date  . Allergy   . Chronic constipation     Past Surgical History:  Procedure Laterality Date  . NO PAST SURGERIES      Family History  Problem Relation Age of Onset  . Hypertension Mother   . Hypertension Father   . Hirschsprung's disease Neg Hx     Social History  Substance Use Topics  . Smoking status: Former Smoker    Packs/day: 0.25    Types: Cigarettes  . Smokeless tobacco: Never Used  . Alcohol use No    Allergies:  Allergies  Allergen Reactions  . Food Hives and Rash    Bananas and Apples    Prescriptions Prior to Admission  Medication Sig Dispense Refill Last Dose  . metroNIDAZOLE (FLAGYL) 500 MG tablet Take 1 tablet (500 mg total) by mouth 2 (two) times daily. 14 tablet 0   . Prenat w/o A Vit-FeFum-FePo-FA (CONCEPT OB) 130-92.4-1 MG CAPS Take 1 tablet by mouth daily. 30 capsule 12   . promethazine (PHENERGAN) 25 MG tablet Take 1 tablet (25 mg total) by mouth every 6 (six) hours as needed. 30 tablet 2     Review of Systems  Constitutional: Negative for fever.  Gastrointestinal: Positive for abdominal pain.  Genitourinary: Positive for frequency. Negative for dysuria, hematuria, vaginal bleeding and  vaginal discharge.  Musculoskeletal: Positive for back pain.   Physical Exam   Blood pressure 118/74, pulse 73, temperature 97.8 F (36.6 C), temperature source Oral, resp. rate 16, height 5\' 2"  (1.575 m), weight 83.6 kg (184 lb 4 oz), last menstrual period 09/06/2016, SpO2 100 %, unknown if currently breastfeeding.  Physical Exam  Nursing note and vitals reviewed. Constitutional: She is oriented to person, place, and time. She appears well-developed and well-nourished. No distress (appears comfortable).  HENT:  Head: Normocephalic and atraumatic.  Neck: Normal range of motion.  Cardiovascular: Normal rate.   Respiratory: Effort normal.  GI: Soft. She exhibits no distension. There is no tenderness.  gravid  Genitourinary:  Genitourinary Comments: External: no lesions or erythema Vagina: rugated, nulli, thin white discharge SVE: closed/thick  Musculoskeletal: Normal range of motion.  Neurological: She is alert and oriented to person, place, and time.  Skin: Skin is warm and dry.  Psychiatric: She has a normal mood and affect.   EFM: 145 bpm, mod variability, + accels, occ. variable decels Toco: irritability  Results for orders placed or performed during the hospital encounter of 05/08/17 (from the past 24 hour(s))  Urinalysis, Routine w reflex microscopic  Status: Abnormal   Collection Time: 05/08/17  1:35 PM  Result Value Ref Range   Color, Urine YELLOW YELLOW   APPearance HAZY (A) CLEAR   Specific Gravity, Urine 1.015 1.005 - 1.030   pH 9.0 (H) 5.0 - 8.0   Glucose, UA NEGATIVE NEGATIVE mg/dL   Hgb urine dipstick NEGATIVE NEGATIVE   Bilirubin Urine NEGATIVE NEGATIVE   Ketones, ur NEGATIVE NEGATIVE mg/dL   Protein, ur NEGATIVE NEGATIVE mg/dL   Nitrite NEGATIVE NEGATIVE   Leukocytes, UA TRACE (A) NEGATIVE   RBC / HPF 0-5 0 - 5 RBC/hpf   WBC, UA 0-5 0 - 5 WBC/hpf   Bacteria, UA NONE SEEN NONE SEEN   Squamous Epithelial / LPF 6-30 (A) NONE SEEN   Mucous PRESENT   Wet  prep, genital     Status: Abnormal   Collection Time: 05/08/17  2:14 PM  Result Value Ref Range   Yeast Wet Prep HPF POC NONE SEEN NONE SEEN   Trich, Wet Prep NONE SEEN NONE SEEN   Clue Cells Wet Prep HPF POC NONE SEEN NONE SEEN   WBC, Wet Prep HPF POC FEW (A) NONE SEEN   Sperm NONE SEEN    MAU Course  Procedures  MDM Labs orderd and reviewed. No evidence of UTI, pelvic infection, or PTL. NST reactive after position change and po hydration. Stable for discharge home.  Assessment and Plan   1. [redacted] weeks gestation of pregnancy   2. Abdominal cramping affecting pregnancy   3. NST (non-stress test) reactive    Discharge home Increase water intake-6 bottles per day Follow up in office as scheduled PTL precautions  Allergies as of 05/08/2017      Reactions   Food Hives, Rash   Bananas and Apples      Medication List    STOP taking these medications   metroNIDAZOLE 500 MG tablet Commonly known as:  FLAGYL   promethazine 25 MG tablet Commonly known as:  PHENERGAN     TAKE these medications   CONCEPT OB 130-92.4-1 MG Caps Take 1 tablet by mouth daily.      Donette Larry, CNM 05/08/2017, 2:16 PM

## 2017-05-09 LAB — GC/CHLAMYDIA PROBE AMP (~~LOC~~) NOT AT ARMC
CHLAMYDIA, DNA PROBE: NEGATIVE
Neisseria Gonorrhea: NEGATIVE

## 2017-05-27 LAB — OB RESULTS CONSOLE GC/CHLAMYDIA
Chlamydia: NEGATIVE
GC PROBE AMP, GENITAL: NEGATIVE

## 2017-05-27 LAB — OB RESULTS CONSOLE GBS: STREP GROUP B AG: NEGATIVE

## 2017-06-17 ENCOUNTER — Encounter (HOSPITAL_COMMUNITY): Payer: Self-pay

## 2017-06-17 ENCOUNTER — Inpatient Hospital Stay (HOSPITAL_COMMUNITY)
Admission: AD | Admit: 2017-06-17 | Discharge: 2017-06-17 | Disposition: A | Discharge status: Home / Self Care | Payer: Medicaid Other | Source: Ambulatory Visit | Attending: Obstetrics and Gynecology | Admitting: Obstetrics and Gynecology

## 2017-06-17 DIAGNOSIS — R109 Unspecified abdominal pain: Secondary | ICD-10-CM | POA: Diagnosis present

## 2017-06-17 DIAGNOSIS — O26893 Other specified pregnancy related conditions, third trimester: Secondary | ICD-10-CM | POA: Diagnosis not present

## 2017-06-17 DIAGNOSIS — O479 False labor, unspecified: Secondary | ICD-10-CM

## 2017-06-17 DIAGNOSIS — Z3A39 39 weeks gestation of pregnancy: Secondary | ICD-10-CM | POA: Insufficient documentation

## 2017-06-17 LAB — URINALYSIS, ROUTINE W REFLEX MICROSCOPIC
BILIRUBIN URINE: NEGATIVE
GLUCOSE, UA: NEGATIVE mg/dL
Hgb urine dipstick: NEGATIVE
KETONES UR: NEGATIVE mg/dL
Nitrite: NEGATIVE
PH: 6 (ref 5.0–8.0)
Protein, ur: 30 mg/dL — AB
Specific Gravity, Urine: 1.029 (ref 1.005–1.030)

## 2017-06-17 NOTE — Progress Notes (Addendum)
G2P0 @ 39.2 [redacted]wksga. Presents to triage for cramping since last night. Denies LOF or bleeding. Stating now decreased FM. VSS see flow sheet for details. EFM applied.   SVE: 1/40/-3   1714: Provider notified. Report status of pt given. Pt being d/c home on labor precaution. Provider reviewed strip.  Discharge instructions given with pt understanding. Pt left unit via ambulatory.

## 2017-06-22 ENCOUNTER — Encounter (HOSPITAL_COMMUNITY): Payer: Self-pay | Admitting: *Deleted

## 2017-06-22 ENCOUNTER — Inpatient Hospital Stay (HOSPITAL_COMMUNITY)
Admission: AD | Admit: 2017-06-22 | Discharge: 2017-06-22 | Disposition: A | Discharge status: Home / Self Care | Payer: Medicaid Other | Source: Ambulatory Visit | Attending: Family Medicine | Admitting: Family Medicine

## 2017-06-22 DIAGNOSIS — O479 False labor, unspecified: Secondary | ICD-10-CM | POA: Diagnosis present

## 2017-06-22 DIAGNOSIS — Z3A00 Weeks of gestation of pregnancy not specified: Secondary | ICD-10-CM | POA: Diagnosis not present

## 2017-06-22 NOTE — MAU Note (Signed)
Urine sent to lab 

## 2017-06-22 NOTE — MAU Note (Signed)
I have communicated with Dr. Tawanna SatWoulk and reviewed vital signs:  Vitals:   06/22/17 2130  BP: 124/79  Pulse: (!) 114  Resp: 18  Temp: 98.2 F (36.8 C)    Vaginal exam:  Dilation: 1 Effacement (%): 80 Cervical Position: Middle Station: -3 Presentation: Vertex Exam by:: K.Kambrea Carrasco,RN,   Also reviewed contraction pattern and that non-stress test is reactive.  It has been documented that patient is contracting every 20 minutes with no cervical change since last exam on Monday,  not indicating active labor.  Patient c/o cold symptoms list of OTC medications will be given on discharge.  Based on this report provider has given order for discharge.  A discharge order and diagnosis entered by a provider.   Labor discharge instructions reviewed with patient.

## 2017-06-22 NOTE — MAU Note (Signed)
Pt reports she has been having increased pelvic pressure and pain for about 5 hrs. Denies any vag bleeding or leaking. Good fetal movement reported. C/o cold symptoms x3 days. Was told she could only take tylenol from nurse call line.

## 2017-06-24 ENCOUNTER — Encounter (HOSPITAL_COMMUNITY): Payer: Self-pay | Admitting: *Deleted

## 2017-06-24 ENCOUNTER — Telehealth (HOSPITAL_COMMUNITY): Payer: Self-pay | Admitting: *Deleted

## 2017-06-24 NOTE — Telephone Encounter (Signed)
Preadmission screen  

## 2017-06-25 ENCOUNTER — Encounter (HOSPITAL_COMMUNITY): Payer: Self-pay | Admitting: *Deleted

## 2017-06-26 ENCOUNTER — Other Ambulatory Visit: Payer: Self-pay | Admitting: Advanced Practice Midwife

## 2017-06-28 ENCOUNTER — Inpatient Hospital Stay (HOSPITAL_COMMUNITY): Payer: Medicaid Other | Admitting: Anesthesiology

## 2017-06-28 ENCOUNTER — Inpatient Hospital Stay (HOSPITAL_COMMUNITY)
Admission: AD | Admit: 2017-06-28 | Discharge: 2017-06-30 | DRG: 775 | Disposition: A | Discharge status: Home / Self Care | Payer: Medicaid Other | Source: Ambulatory Visit | Attending: Obstetrics and Gynecology | Admitting: Obstetrics and Gynecology

## 2017-06-28 ENCOUNTER — Encounter (HOSPITAL_COMMUNITY): Payer: Self-pay

## 2017-06-28 DIAGNOSIS — K5909 Other constipation: Secondary | ICD-10-CM | POA: Diagnosis present

## 2017-06-28 DIAGNOSIS — Z87891 Personal history of nicotine dependence: Secondary | ICD-10-CM | POA: Diagnosis not present

## 2017-06-28 DIAGNOSIS — Z3A40 40 weeks gestation of pregnancy: Secondary | ICD-10-CM | POA: Diagnosis not present

## 2017-06-28 DIAGNOSIS — O163 Unspecified maternal hypertension, third trimester: Secondary | ICD-10-CM

## 2017-06-28 DIAGNOSIS — Z3A41 41 weeks gestation of pregnancy: Secondary | ICD-10-CM

## 2017-06-28 DIAGNOSIS — F4321 Adjustment disorder with depressed mood: Secondary | ICD-10-CM | POA: Diagnosis present

## 2017-06-28 DIAGNOSIS — O99344 Other mental disorders complicating childbirth: Secondary | ICD-10-CM | POA: Diagnosis present

## 2017-06-28 DIAGNOSIS — O480 Post-term pregnancy: Secondary | ICD-10-CM | POA: Diagnosis present

## 2017-06-28 LAB — CBC
HCT: 34.9 % — ABNORMAL LOW (ref 36.0–46.0)
HEMOGLOBIN: 11.8 g/dL — AB (ref 12.0–15.0)
MCH: 29.1 pg (ref 26.0–34.0)
MCHC: 33.8 g/dL (ref 30.0–36.0)
MCV: 86.2 fL (ref 78.0–100.0)
Platelets: 211 10*3/uL (ref 150–400)
RBC: 4.05 MIL/uL (ref 3.87–5.11)
RDW: 15.2 % (ref 11.5–15.5)
WBC: 11.7 10*3/uL — AB (ref 4.0–10.5)

## 2017-06-28 LAB — TYPE AND SCREEN
ABO/RH(D): A POS
Antibody Screen: NEGATIVE

## 2017-06-28 LAB — ABO/RH: ABO/RH(D): A POS

## 2017-06-28 LAB — RPR: RPR: NONREACTIVE

## 2017-06-28 MED ORDER — LIDOCAINE HCL (PF) 1 % IJ SOLN
INTRAMUSCULAR | Status: DC | PRN
  Administered 2017-06-28: 6 mL via EPIDURAL
  Administered 2017-06-28: 8 mL via EPIDURAL

## 2017-06-28 MED ORDER — SENNOSIDES-DOCUSATE SODIUM 8.6-50 MG PO TABS
2.0000 | ORAL_TABLET | ORAL | Status: DC
  Administered 2017-06-29 – 2017-06-30 (×2): 2 via ORAL
  Filled 2017-06-28 (×2): qty 2

## 2017-06-28 MED ORDER — ONDANSETRON HCL 4 MG/2ML IJ SOLN: 4.0000 mg | Freq: Four times a day (QID) | INTRAMUSCULAR | Status: DC | PRN

## 2017-06-28 MED ORDER — LIDOCAINE HCL (PF) 1 % IJ SOLN: 30.0000 mL | INTRAMUSCULAR | Status: DC | PRN

## 2017-06-28 MED ORDER — EPHEDRINE 5 MG/ML INJ
10.0000 mg | INTRAVENOUS | Status: DC | PRN
  Filled 2017-06-28: qty 2

## 2017-06-28 MED ORDER — PRENATAL MULTIVITAMIN CH
1.0000 | ORAL_TABLET | Freq: Every day | ORAL | Status: DC
  Administered 2017-06-29: 1 via ORAL
  Filled 2017-06-28: qty 1

## 2017-06-28 MED ORDER — PHENYLEPHRINE 40 MCG/ML (10ML) SYRINGE FOR IV PUSH (FOR BLOOD PRESSURE SUPPORT)
80.0000 ug | PREFILLED_SYRINGE | INTRAVENOUS | Status: DC | PRN
  Administered 2017-06-28: 80 ug via INTRAVENOUS
  Filled 2017-06-28: qty 5

## 2017-06-28 MED ORDER — PHENYLEPHRINE 40 MCG/ML (10ML) SYRINGE FOR IV PUSH (FOR BLOOD PRESSURE SUPPORT)
80.0000 ug | PREFILLED_SYRINGE | INTRAVENOUS | Status: DC | PRN
  Filled 2017-06-28: qty 5
  Filled 2017-06-28: qty 10

## 2017-06-28 MED ORDER — DIPHENHYDRAMINE HCL 50 MG/ML IJ SOLN: 12.5000 mg | INTRAMUSCULAR | Status: DC | PRN

## 2017-06-28 MED ORDER — SOD CITRATE-CITRIC ACID 500-334 MG/5ML PO SOLN: 30.0000 mL | ORAL | Status: DC | PRN

## 2017-06-28 MED ORDER — MISOPROSTOL 25 MCG QUARTER TABLET
25.0000 ug | ORAL_TABLET | ORAL | Status: DC | PRN
  Administered 2017-06-28: 25 ug via VAGINAL
  Filled 2017-06-28: qty 1

## 2017-06-28 MED ORDER — ZOLPIDEM TARTRATE 5 MG PO TABS: 5.0000 mg | ORAL_TABLET | Freq: Every evening | ORAL | Status: DC | PRN

## 2017-06-28 MED ORDER — ONDANSETRON HCL 4 MG PO TABS: 4.0000 mg | ORAL_TABLET | ORAL | Status: DC | PRN

## 2017-06-28 MED ORDER — LACTATED RINGERS IV SOLN
INTRAVENOUS | Status: DC
  Administered 2017-06-28: 09:00:00 via INTRAVENOUS

## 2017-06-28 MED ORDER — FENTANYL 2.5 MCG/ML BUPIVACAINE 1/10 % EPIDURAL INFUSION (WH - ANES)
14.0000 mL/h | INTRAMUSCULAR | Status: DC | PRN
  Administered 2017-06-28: 14 mL/h via EPIDURAL
  Filled 2017-06-28: qty 100

## 2017-06-28 MED ORDER — SIMETHICONE 80 MG PO CHEW: 80.0000 mg | CHEWABLE_TABLET | ORAL | Status: DC | PRN

## 2017-06-28 MED ORDER — LACTATED RINGERS IV SOLN: 500.0000 mL | INTRAVENOUS | Status: DC | PRN

## 2017-06-28 MED ORDER — LACTATED RINGERS IV SOLN
500.0000 mL | Freq: Once | INTRAVENOUS | Status: AC
  Administered 2017-06-28: 500 mL via INTRAVENOUS

## 2017-06-28 MED ORDER — TERBUTALINE SULFATE 1 MG/ML IJ SOLN
0.2500 mg | Freq: Once | INTRAMUSCULAR | Status: DC | PRN
  Filled 2017-06-28: qty 1

## 2017-06-28 MED ORDER — WITCH HAZEL-GLYCERIN EX PADS: 1.0000 "application " | MEDICATED_PAD | CUTANEOUS | Status: DC | PRN

## 2017-06-28 MED ORDER — TETANUS-DIPHTH-ACELL PERTUSSIS 5-2.5-18.5 LF-MCG/0.5 IM SUSP: 0.5000 mL | Freq: Once | INTRAMUSCULAR | Status: DC

## 2017-06-28 MED ORDER — OXYTOCIN BOLUS FROM INFUSION: 500.0000 mL | Freq: Once | INTRAVENOUS | Status: DC

## 2017-06-28 MED ORDER — ACETAMINOPHEN 325 MG PO TABS: 650.0000 mg | ORAL_TABLET | ORAL | Status: DC | PRN

## 2017-06-28 MED ORDER — OXYCODONE-ACETAMINOPHEN 5-325 MG PO TABS: 1.0000 | ORAL_TABLET | ORAL | Status: DC | PRN

## 2017-06-28 MED ORDER — OXYTOCIN 40 UNITS IN LACTATED RINGERS INFUSION - SIMPLE MED
2.5000 [IU]/h | INTRAVENOUS | Status: DC
  Administered 2017-06-28: 2.5 [IU]/h via INTRAVENOUS
  Filled 2017-06-28: qty 1000

## 2017-06-28 MED ORDER — ACETAMINOPHEN 325 MG PO TABS
650.0000 mg | ORAL_TABLET | ORAL | Status: DC | PRN
  Administered 2017-06-29 (×2): 650 mg via ORAL
  Filled 2017-06-28 (×2): qty 2

## 2017-06-28 MED ORDER — OXYCODONE-ACETAMINOPHEN 5-325 MG PO TABS: 2.0000 | ORAL_TABLET | ORAL | Status: DC | PRN

## 2017-06-28 MED ORDER — DIBUCAINE 1 % RE OINT: 1.0000 "application " | TOPICAL_OINTMENT | RECTAL | Status: DC | PRN

## 2017-06-28 MED ORDER — DIPHENHYDRAMINE HCL 25 MG PO CAPS: 25.0000 mg | ORAL_CAPSULE | Freq: Four times a day (QID) | ORAL | Status: DC | PRN

## 2017-06-28 MED ORDER — COCONUT OIL OIL
1.0000 "application " | TOPICAL_OIL | Status: DC | PRN
  Filled 2017-06-28: qty 120

## 2017-06-28 MED ORDER — BENZOCAINE-MENTHOL 20-0.5 % EX AERO
1.0000 "application " | INHALATION_SPRAY | CUTANEOUS | Status: DC | PRN
  Administered 2017-06-29 – 2017-06-30 (×2): 1 via TOPICAL
  Filled 2017-06-28 (×2): qty 56

## 2017-06-28 MED ORDER — FENTANYL CITRATE (PF) 100 MCG/2ML IJ SOLN: 100.0000 ug | INTRAMUSCULAR | Status: DC | PRN

## 2017-06-28 MED ORDER — LACTATED RINGERS IV SOLN
INTRAVENOUS | Status: DC
  Administered 2017-06-28: 12:00:00 via INTRAVENOUS

## 2017-06-28 MED ORDER — IBUPROFEN 600 MG PO TABS
600.0000 mg | ORAL_TABLET | Freq: Four times a day (QID) | ORAL | Status: DC
  Administered 2017-06-29 – 2017-06-30 (×6): 600 mg via ORAL
  Filled 2017-06-28 (×6): qty 1

## 2017-06-28 MED ORDER — ONDANSETRON HCL 4 MG/2ML IJ SOLN: 4.0000 mg | INTRAMUSCULAR | Status: DC | PRN

## 2017-06-28 NOTE — Progress Notes (Signed)
   Shannel [place] is a 21 y.o. G2P0010 at 5627w6d  admitted for active labor  Subjective: Patient coping well; comfortable with epidural  Objective: Vitals:   06/28/17 1156 06/28/17 1201 06/28/17 1231 06/28/17 1301  BP: 127/75 125/72 116/66 119/68  Pulse: 66 67 76 69  Resp:      Temp:      Weight:      Height:       No intake/output data recorded.  FHT:  FHR: 120 bpm, variability: moderate,  accelerations:  Present,  decelerations:  Absent UC:   regular, every 2-3 minutes SVE:   Dilation: 5 Effacement (%): 100 Station: -1 Exam by:: wilkins rnc Pitocin @ 0 mu/min  Labs: Lab Results  Component Value Date   WBC 11.7 (H) 06/28/2017   HGB 11.8 (L) 06/28/2017   HCT 34.9 (L) 06/28/2017   MCV 86.2 06/28/2017   PLT 211 06/28/2017    Assessment / Plan: Augmentation of labor, progressing well S/p Cytotec x 1   Fetal bradycardia at 1110 after epidural infusion began; FHR responded to fluid bolus, vasopressors and position changes.  Labor: Progressing normally Fetal Wellbeing:  Category I Pain Control:  Epidural Anticipated MOD:  NSVD  Charlesetta GaribaldiKathryn Lorraine Kooistra 06/28/2017, 1:26 PM   note

## 2017-06-28 NOTE — Anesthesia Preprocedure Evaluation (Signed)
Anesthesia Evaluation  Patient identified by MRN, date of birth, ID band Patient awake    Reviewed: Allergy & Precautions, H&P , NPO status , Patient's Chart, lab work & pertinent test results  Airway Mallampati: I  TM Distance: >3 FB Neck ROM: full    Dental no notable dental hx.    Pulmonary neg pulmonary ROS, former smoker,    Pulmonary exam normal breath sounds clear to auscultation       Cardiovascular negative cardio ROS Normal cardiovascular exam Rhythm:Regular Rate:Normal     Neuro/Psych negative neurological ROS  negative psych ROS   GI/Hepatic negative GI ROS, Neg liver ROS,   Endo/Other  negative endocrine ROS  Renal/GU negative Renal ROS  negative genitourinary   Musculoskeletal negative musculoskeletal ROS (+)   Abdominal (+) + obese,   Peds  Hematology negative hematology ROS (+)   Anesthesia Other Findings   Reproductive/Obstetrics (+) Pregnancy                             Anesthesia Physical Anesthesia Plan  ASA: II  Anesthesia Plan: Epidural   Post-op Pain Management:    Induction:   PONV Risk Score and Plan:   Airway Management Planned:   Additional Equipment:   Intra-op Plan:   Post-operative Plan:   Informed Consent: I have reviewed the patients History and Physical, chart, labs and discussed the procedure including the risks, benefits and alternatives for the proposed anesthesia with the patient or authorized representative who has indicated his/her understanding and acceptance.     Plan Discussed with:   Anesthesia Plan Comments:         Anesthesia Quick Evaluation

## 2017-06-28 NOTE — Progress Notes (Signed)
Labor Progress Note  S: Patient without any complaints, resting comfortably in bed. Continues to have some bloody and mucus discharge.  O:  BP 118/74   Pulse 69   Temp 98.2 F (36.8 C)   Resp 18   Ht 5\' 2"  (1.575 m)   Wt 86.2 kg (190 lb)   LMP 09/06/2016   BMI 34.75 kg/m   FHT: Baseline 120, moderate variability, accels present, few late decels present.  CVE: Dilation: 5 Effacement (%): 100 Cervical Position: Anterior Station: -1 Presentation: Vertex Exam by:: wilkins rnc   A&P: 21 y.o. G2P0010 2271w6d  IUPC placed Labor progressing normally s/p cytotec x1 for augmentation. Pain controlled with epidural Anticipate SVD.   Bailey Carlson, Medical Student 4:07 PM

## 2017-06-28 NOTE — MAU Note (Signed)
Patient presents with contractions during the night uncomfortable, patient denies vaginal bleeding or LOF
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
## 2017-06-28 NOTE — Anesthesia Procedure Notes (Signed)
Epidural Patient location during procedure: OB Start time: 06/28/2017 11:15 AM End time: 06/28/2017 11:22 AM  Staffing Anesthesiologist: Leilani AbleHATCHETT, Roc Streett Performed: anesthesiologist   Preanesthetic Checklist Completed: patient identified, surgical consent, pre-op evaluation, timeout performed, IV checked, risks and benefits discussed and monitors and equipment checked  Epidural Patient position: sitting Prep: site prepped and draped and DuraPrep Patient monitoring: continuous pulse ox and blood pressure Approach: midline Location: L3-L4 Injection technique: LOR air  Needle:  Needle type: Tuohy  Needle gauge: 17 G Needle length: 9 cm and 9 Needle insertion depth: 6 cm Catheter type: closed end flexible Catheter size: 19 Gauge Catheter at skin depth: 11 cm Test dose: negative and Other  Assessment Sensory level: T9 Events: blood not aspirated, injection not painful, no injection resistance, negative IV test and no paresthesia  Additional Notes Reason for block:procedure for pain

## 2017-06-28 NOTE — Anesthesia Pain Management Evaluation Note (Signed)
  CRNA Pain Management Visit Note  Patient: Bailey Carlson, 21 y.o., female  "Hello I am a [facility] of the [facility]. We have an anesthesia team available at all times to provide care throughout the hospital, including epidural management and anesthesia for C-section. I don't know your plan for the delivery whether it a natural birth, water birth, IV sedation, nitrous supplementation, doula or epidural, but we want to meet your pain goals."   1.Was your pain managed to your expectations on prior hospitalizations?   Yes   2.What is your expectation for pain management during this hospitalization?     Epidural  3.How can we help you reach that goal? epidural  Record the patient's initial score and the patient's pain goal.   Pain: 1  Pain Goal: 5 [facility] wants you to be able to say your pain was always managed very well.  Bailey Carlson 06/28/2017

## 2017-06-28 NOTE — MAU Note (Signed)
Pains started about 0530. Cramping/contraction like pain.  No bleeding or leaking, lots of slimy d/c.  Denies problems with pregnancy.  Was 1+ when last checked.
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
## 2017-06-29 ENCOUNTER — Inpatient Hospital Stay (HOSPITAL_COMMUNITY): Admission: RE | Admit: 2017-06-29 | Payer: Medicaid Other | Source: Ambulatory Visit

## 2017-06-29 LAB — CBC
HCT: 28.4 % — ABNORMAL LOW (ref 36.0–46.0)
HEMOGLOBIN: 10 g/dL — AB (ref 12.0–15.0)
MCH: 30 pg (ref 26.0–34.0)
MCHC: 35.2 g/dL (ref 30.0–36.0)
MCV: 85.3 fL (ref 78.0–100.0)
PLATELETS: 161 10*3/uL (ref 150–400)
RBC: 3.33 MIL/uL — AB (ref 3.87–5.11)
RDW: 15.1 % (ref 11.5–15.5)
WBC: 15.3 10*3/uL — AB (ref 4.0–10.5)

## 2017-06-29 NOTE — Progress Notes (Signed)
Post Partum Day 1 Subjective: no complaints, up ad lib, voiding, tolerating PO, + flatus and Wants IV out  Objective: Blood pressure 112/68, pulse 84, temperature 99.2 F (37.3 C), temperature source Oral, resp. rate 18, height 5\' 2"  (1.575 m), weight 190 lb (86.2 kg), last menstrual period 09/06/2016, SpO2 100 %, unknown if currently breastfeeding.  Physical Exam:  General: alert, cooperative and no distress Lochia: appropriate Uterine Fundus: firm Incision: healing well DVT Evaluation: No evidence of DVT seen on physical exam. Negative Homan's sign.   Recent Labs  06/28/17 0837 06/29/17 0534  HGB 11.8* 10.0*  HCT 34.9* 28.4*    Assessment/Plan: Plan for discharge tomorrow and Breastfeeding  D/C IV   LOS: 1 day   Wynelle BourgeoisMarie Kathaleen Dudziak 06/29/2017, 7:00 AM

## 2017-06-29 NOTE — Progress Notes (Signed)
POSTPARTUM PROGRESS NOTE  Post Partum Day #1 s/p SVD  Subjective:  Bailey Carlson is a 21 y.o. G2P1011 3685w6d s/p SVD.  No acute events overnight.  Pt denies problems with ambulating, voiding or po intake.  She denies nausea or vomiting.  Pain is well controlled.  She has not had flatus. She has had bowel movement.  Lochia Minimal.   Objective: Blood pressure 112/68, pulse 84, temperature 99.2 F (37.3 C), temperature source Oral, resp. rate 18, height 5\' 2"  (1.575 m), weight 86.2 kg (190 lb), last menstrual period 09/06/2016, SpO2 100 %, unknown if currently breastfeeding.  Physical Exam:  General: alert, cooperative and no distress Lochia:normal flow Uterine Fundus: firm DVT Evaluation: No calf swelling or tenderness Extremities: no LE edema   Recent Labs  06/28/17 0837 06/29/17 0534  HGB 11.8* 10.0*  HCT 34.9* 28.4*    Assessment/Plan:  ASSESSMENT: Bailey Carlson is a 21 y.o. [id number] 1285w6d s/p SVD  Plan for discharge tomorrow   LOS: 1 day   Howard PouchLauren Feng, MD PGY-2 [facility]  06/29/2017, 6:55 AM

## 2017-06-29 NOTE — Lactation Note (Signed)
This note was copied from a baby's chart. Lactation Consultation Note  Patient Name: Bailey Carlson Today's Date: 06/29/2017 Reason for consult: Initial assessment  Follow up visit at 29 hours of age.  Mom reports wanting to pump and bottle feed.  Mom has pump and supplies, but not set up yet.  LC set up DEBP with instructions on use and cleaning.  Mom is feeding baby a bottle so LC only briefly fit mom with #24 flanges with good fit.  Mom to pump 8 x/24 hours on initiate phase and then work on hand expression.  Mom encouraged to wipe drops to baby's mouth, spoon feed or bottle feed as she desires.   LC reported to RN as bedside.  Mom to call as needed.     Maternal Data Does the patient have breastfeeding experience prior to this delivery?: No  Feeding Feeding Type: Bottle Fed - Formula  LATCH Score/Interventions                      Lactation Tools Discussed/Used Pump Review: Setup, frequency, and cleaning Initiated by:: JS IBCLC Date initiated:: 06/29/17   Consult Status Consult Status: Follow-up Date: 06/30/17 Follow-up type: In-patient    Bailey Carlson, Bailey Carlson 06/29/2017, 10:55 PM
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
## 2017-06-29 NOTE — Anesthesia Postprocedure Evaluation (Signed)
Anesthesia Post Note  Patient: Bailey Carlson  Procedure(s) Performed: * No procedures listed *     Anesthesia Type: Epidural    Last Vitals:  Vitals:   06/29/17 0102 06/29/17 0900  BP: 112/68 123/76  Pulse: 84 80  Resp: 18 18  Temp: 37.3 C 36.9 C    Last Pain:  Vitals:   06/29/17 0919  TempSrc:   PainSc: 0-No pain   Pain Goal:                 [employer]CorpBURGER,Radwan Cowley

## 2017-06-30 MED ORDER — SENNOSIDES-DOCUSATE SODIUM 8.6-50 MG PO TABS: 2.0000 | ORAL_TABLET | ORAL | 0 refills | Status: DC

## 2017-06-30 MED ORDER — IBUPROFEN 600 MG PO TABS: 600.0000 mg | ORAL_TABLET | Freq: Four times a day (QID) | ORAL | 0 refills | Status: DC

## 2017-06-30 MED ORDER — PRENATAL MULTIVITAMIN CH: 1.0000 | ORAL_TABLET | Freq: Every day | ORAL | 0 refills | Status: DC

## 2017-06-30 NOTE — Lactation Note (Signed)
This note was copied from a baby's chart. Lactation Consultation Note  Patient Name: Bailey Carlson Today's Date: 06/30/2017 Reason for consult: Follow-up assessment [facility] is 6641 hours old [facility]. RN had called LC saying mom wanted help how to use her personal pump.  Mom had a Medela Pump 'n Style. Showed mom how to use it. Mom reports a LC set up a DEBP last night but she hasn't really used it but plans to pump once home. Mom reports she is starting to feel more full. Discussed importance of pumping at least 8x in 24hrs for 15-20 mins. Reviewed hand expression with mom and encouraged mom to do so after pumping. Mom reports no questions at this time. Encouraged mom to call o/p number if she has any later.  Maternal Data    Feeding Feeding Type: Bottle Fed - Formula Nipple Type: Slow - flow  LATCH Score/Interventions                      Lactation Tools Discussed/Used     Consult Status Consult Status: Complete    Oneal GroutLaura C Jermon Chalfant 06/30/2017, 10:50 AM

## 2017-06-30 NOTE — Discharge Summary (Signed)
OB Discharge Summary     Patient Name: Bailey Carlson DOB: August 04, 1996 MRN: [id number]  Date of admission: 06/28/2017 Delivering MD: Marylene LandKOOISTRA, KATHRYN LORRAINE   Date of discharge: 06/30/2017  Admitting diagnosis: CRAMPS,CTX LIKE PAIN Intrauterine pregnancy: 8021w6d     Secondary diagnosis:  Active Problems:   Post term pregnancy at [redacted] weeks gestation  Additional problems:  Patient Active Problem List   Diagnosis Date Noted  . Post term pregnancy at [redacted] weeks gestation 06/28/2017  . Adjustment disorder with depressed mood 10/06/2014  . Chronic constipation         Discharge diagnosis: Term Pregnancy Delivered, cervical laceration                                                                                                Post partum procedures:none  Augmentation: Cytotec  Complications: None  Hospital course:  Onset of Labor With Vaginal Delivery     21 y.o. yo G2P1011 at 7921w6d was admitted in labor on 06/28/2017. Patient had an uncomplicated labor course as follows:  Membrane Rupture Time/Date: 11:00 AM ,06/28/2017   Intrapartum Procedures: Episiotomy: None [1]                                         Lacerations:  2nd degree [3];Periurethral [8];Perineal [11];Cervical [7]  Patient had a delivery of a Viable infant. 06/28/2017  Information for the patient's newborn:  [place], Girl Kyan [[id number]][[id number]]  Delivery Method: Vag-Spont    Pateint had an uncomplicated postpartum course.  She is ambulating, tolerating a regular diet, passing flatus, and urinating well. Patient is discharged home in stable condition on 06/30/17.   Physical exam  Vitals:   06/29/17 0900 06/29/17 1714 06/29/17 2219 06/30/17 0605  BP: 123/76 126/80 119/67 138/72  Pulse: 80 79 81 82  Resp: 18 18 18 18   Temp: 98.4 F (36.9 C) 98.3 F (36.8 C) 98.1 F (36.7 C) 97.8 F (36.6 C)  TempSrc: Oral Oral Oral Oral  SpO2: 100% 100% 100% 100%  Weight:      Height:       General: alert, cooperative  and no distress Lochia: appropriate Uterine Fundus: firm Incision: N/A DVT Evaluation: No evidence of DVT seen on physical exam. Labs: Lab Results  Component Value Date   WBC 15.3 (H) 06/29/2017   HGB 10.0 (L) 06/29/2017   HCT 28.4 (L) 06/29/2017   MCV 85.3 06/29/2017   PLT 161 06/29/2017   CMP Latest Ref Rng & Units 10/30/2016  Glucose 65 - 99 mg/dL 782(N106(H)  BUN 6 - 20 mg/dL 6  Creatinine 5.620.44 - 1.301.00 mg/dL 8.650.80  Sodium 784135 - 696145 mmol/L 133(L)  Potassium 3.5 - 5.1 mmol/L 3.7  Chloride 101 - 111 mmol/L 103  CO2 22 - 32 mmol/L 24  Calcium 8.9 - 10.3 mg/dL 9.2  Total Protein 6.5 - 8.1 g/dL 2.9(B8.5(H)  Total Bilirubin 0.3 - 1.2 mg/dL 0.6  Alkaline Phos 38 - 126 U/L 45  AST 15 - 41 U/L 16  ALT 14 - 54 U/L 9(L)    Discharge instruction: per After Visit Summary and "Baby and Me Booklet".  After visit meds:  Allergies as of 06/30/2017      Reactions   Food Hives, Rash   Bananas and Apples      Medication List    TAKE these medications   ibuprofen 600 MG tablet Commonly known as:  ADVIL,MOTRIN Take 1 tablet (600 mg total) by mouth every 6 (six) hours.   prenatal multivitamin Tabs tablet Take 1 tablet by mouth daily at 12 noon.   senna-docusate 8.6-50 MG tablet Commonly known as:  Senokot-S Take 2 tablets by mouth daily. Start taking on:  07/01/2017       Diet: routine diet  Activity: Advance as tolerated. Pelvic rest for 6 weeks.   Outpatient follow up:6 weeks Follow up Appt:No future appointments. Follow up Visit:No Follow-up on file.  Postpartum contraception: IUD    Newborn Data: Live born female  Birth Weight: 6 lb 8.6 oz (2965 g) APGAR: 8, 9  Baby Feeding: Breast and bottle Disposition:home with mother   06/30/2017 Howard Pouch, MD  OB FELLOW DISCHARGE ATTESTATION  I confirm that I have verified the information documented in the resident's note and that I have also personally reperformed the physical exam and all medical decision making  activities.     Ernestina Penna, MD 8:30 AM

## 2017-06-30 NOTE — Discharge Instructions (Signed)

## 2020-06-27 ENCOUNTER — Emergency Department (HOSPITAL_COMMUNITY): Payer: Medicaid Other | Admitting: Certified Registered"

## 2020-06-27 ENCOUNTER — Observation Stay (HOSPITAL_COMMUNITY)
Admission: EM | Admit: 2020-06-27 | Discharge: 2020-06-28 | Disposition: A | Discharge status: Home / Self Care | Payer: Medicaid Other | Attending: General Surgery | Admitting: General Surgery

## 2020-06-27 ENCOUNTER — Emergency Department (HOSPITAL_COMMUNITY): Payer: Medicaid Other

## 2020-06-27 ENCOUNTER — Encounter (HOSPITAL_COMMUNITY)
Admission: EM | Disposition: A | Discharge status: Home / Self Care | Payer: Self-pay | Source: Home / Self Care | Attending: Emergency Medicine

## 2020-06-27 ENCOUNTER — Other Ambulatory Visit: Payer: Self-pay

## 2020-06-27 DIAGNOSIS — W3400XA Accidental discharge from unspecified firearms or gun, initial encounter: Secondary | ICD-10-CM | POA: Insufficient documentation

## 2020-06-27 DIAGNOSIS — Z20822 Contact with and (suspected) exposure to covid-19: Secondary | ICD-10-CM | POA: Insufficient documentation

## 2020-06-27 DIAGNOSIS — T1490XA Injury, unspecified, initial encounter: Secondary | ICD-10-CM

## 2020-06-27 DIAGNOSIS — Z9889 Other specified postprocedural states: Secondary | ICD-10-CM

## 2020-06-27 DIAGNOSIS — S3992XA Unspecified injury of lower back, initial encounter: Secondary | ICD-10-CM | POA: Diagnosis not present

## 2020-06-27 DIAGNOSIS — Z975 Presence of (intrauterine) contraceptive device: Secondary | ICD-10-CM | POA: Insufficient documentation

## 2020-06-27 DIAGNOSIS — Y249XXA Unspecified firearm discharge, undetermined intent, initial encounter: Secondary | ICD-10-CM

## 2020-06-27 HISTORY — PX: LAPAROSCOPY: SHX197

## 2020-06-27 LAB — SARS CORONAVIRUS 2 BY RT PCR (HOSPITAL ORDER, PERFORMED IN ~~LOC~~ HOSPITAL LAB): SARS Coronavirus 2: NEGATIVE

## 2020-06-27 LAB — LACTIC ACID, PLASMA
Lactic Acid, Venous: 2.9 mmol/L (ref 0.5–1.9)
Lactic Acid, Venous: 2.7 mmol/L (ref 0.5–1.9)

## 2020-06-27 LAB — CBC
HCT: 37.8 % (ref 36.0–46.0)
Hemoglobin: 12.3 g/dL (ref 12.0–15.0)
MCH: 28.8 pg (ref 26.0–34.0)
MCHC: 32.5 g/dL (ref 30.0–36.0)
MCV: 88.5 fL (ref 80.0–100.0)
Platelets: 321 10*3/uL (ref 150–400)
RBC: 4.27 MIL/uL (ref 3.87–5.11)
RDW: 14.3 % (ref 11.5–15.5)
WBC: 15.1 10*3/uL — ABNORMAL HIGH (ref 4.0–10.5)
nRBC: 0 % (ref 0.0–0.2)

## 2020-06-27 LAB — I-STAT VENOUS BLOOD GAS, ED
Acid-Base Excess: 0 mmol/L (ref 0.0–2.0)
Bicarbonate: 22.6 mmol/L (ref 20.0–28.0)
Calcium, Ion: 1.08 mmol/L — ABNORMAL LOW (ref 1.15–1.40)
HCT: 40 % (ref 36.0–46.0)
Hemoglobin: 13.6 g/dL (ref 12.0–15.0)
O2 Saturation: 83 %
Potassium: 2.7 mmol/L — CL (ref 3.5–5.1)
Sodium: 141 mmol/L (ref 135–145)
TCO2: 24 mmol/L (ref 22–32)
pCO2, Ven: 29.6 mmHg — ABNORMAL LOW (ref 44.0–60.0)
pH, Ven: 7.491 — ABNORMAL HIGH (ref 7.250–7.430)
pO2, Ven: 43 mmHg (ref 32.0–45.0)

## 2020-06-27 LAB — TYPE AND SCREEN
ABO/RH(D): A POS
Antibody Screen: NEGATIVE

## 2020-06-27 LAB — COMPREHENSIVE METABOLIC PANEL
ALT: 11 U/L (ref 0–44)
AST: 20 U/L (ref 15–41)
Albumin: 4 g/dL (ref 3.5–5.0)
Alkaline Phosphatase: 54 U/L (ref 38–126)
Anion gap: 13 (ref 5–15)
BUN: 8 mg/dL (ref 6–20)
CO2: 21 mmol/L — ABNORMAL LOW (ref 22–32)
Calcium: 9 mg/dL (ref 8.9–10.3)
Chloride: 102 mmol/L (ref 98–111)
Creatinine, Ser: 0.95 mg/dL (ref 0.44–1.00)
GFR calc Af Amer: >60 mL/min (ref >60)
GFR calc non Af Amer: >60 mL/min (ref >60)
Glucose, Bld: 146 mg/dL — ABNORMAL HIGH (ref 70–99)
Potassium: 2.6 mmol/L — CL (ref 3.5–5.1)
Sodium: 136 mmol/L (ref 135–145)
Total Bilirubin: 0.8 mg/dL (ref 0.3–1.2)
Total Protein: 7.9 g/dL (ref 6.5–8.1)

## 2020-06-27 LAB — PROTIME-INR
INR: 1.1 (ref 0.8–1.2)
Prothrombin Time: 13.4 seconds (ref 11.4–15.2)

## 2020-06-27 LAB — ETHANOL: Alcohol, Ethyl (B): <10 mg/dL (ref <10)

## 2020-06-27 LAB — HIV ANTIBODY (ROUTINE TESTING W REFLEX): HIV Screen 4th Generation wRfx: NONREACTIVE

## 2020-06-27 LAB — ABO/RH: ABO/RH(D): A POS

## 2020-06-27 SURGERY — LAPAROSCOPY, DIAGNOSTIC
Anesthesia: General | Site: Abdomen

## 2020-06-27 MED ORDER — DEXAMETHASONE SODIUM PHOSPHATE 10 MG/ML IJ SOLN
INTRAMUSCULAR | Status: DC | PRN
  Administered 2020-06-27: 10 mg via INTRAVENOUS

## 2020-06-27 MED ORDER — SODIUM CHLORIDE 0.9 % IR SOLN
Status: DC | PRN
  Administered 2020-06-27: 1000 mL

## 2020-06-27 MED ORDER — MIDAZOLAM HCL 2 MG/2ML IJ SOLN
INTRAMUSCULAR | Status: AC
  Filled 2020-06-27: qty 2

## 2020-06-27 MED ORDER — ONDANSETRON HCL 4 MG/2ML IJ SOLN: 4.0000 mg | Freq: Four times a day (QID) | INTRAMUSCULAR | Status: DC | PRN

## 2020-06-27 MED ORDER — MIDAZOLAM HCL 5 MG/5ML IJ SOLN
INTRAMUSCULAR | Status: DC | PRN
  Administered 2020-06-27: 2 mg via INTRAVENOUS

## 2020-06-27 MED ORDER — PROPOFOL 10 MG/ML IV BOLUS
INTRAVENOUS | Status: DC | PRN
  Administered 2020-06-27: 150 mg via INTRAVENOUS
  Administered 2020-06-27: 20 mg via INTRAVENOUS

## 2020-06-27 MED ORDER — AMISULPRIDE (ANTIEMETIC) 5 MG/2ML IV SOLN: 10.0000 mg | Freq: Once | INTRAVENOUS | Status: DC | PRN

## 2020-06-27 MED ORDER — FENTANYL CITRATE (PF) 100 MCG/2ML IJ SOLN
INTRAMUSCULAR | Status: DC | PRN
  Administered 2020-06-27 (×2): 100 ug via INTRAVENOUS
  Administered 2020-06-27: 50 ug via INTRAVENOUS

## 2020-06-27 MED ORDER — TETANUS-DIPHTH-ACELL PERTUSSIS 5-2.5-18.5 LF-MCG/0.5 IM SUSP: 0.5000 mL | Freq: Once | INTRAMUSCULAR | Status: DC

## 2020-06-27 MED ORDER — ONDANSETRON 4 MG PO TBDP: 4.0000 mg | ORAL_TABLET | Freq: Four times a day (QID) | ORAL | Status: DC | PRN

## 2020-06-27 MED ORDER — MORPHINE SULFATE (PF) 4 MG/ML IV SOLN: 4.0000 mg | Freq: Once | INTRAVENOUS | Status: DC

## 2020-06-27 MED ORDER — FENTANYL CITRATE (PF) 250 MCG/5ML IJ SOLN
INTRAMUSCULAR | Status: AC
  Filled 2020-06-27: qty 5

## 2020-06-27 MED ORDER — LIDOCAINE 2% (20 MG/ML) 5 ML SYRINGE
INTRAMUSCULAR | Status: DC | PRN
  Administered 2020-06-27: 60 mg via INTRAVENOUS

## 2020-06-27 MED ORDER — SUGAMMADEX SODIUM 200 MG/2ML IV SOLN
INTRAVENOUS | Status: DC | PRN
  Administered 2020-06-27: 400 mg via INTRAVENOUS

## 2020-06-27 MED ORDER — DEXMEDETOMIDINE HCL 200 MCG/2ML IV SOLN
INTRAVENOUS | Status: DC | PRN
  Administered 2020-06-27: 16 ug via INTRAVENOUS

## 2020-06-27 MED ORDER — ACETAMINOPHEN 325 MG PO TABS
650.0000 mg | ORAL_TABLET | ORAL | Status: DC | PRN
  Administered 2020-06-27: 650 mg via ORAL
  Filled 2020-06-27: qty 2

## 2020-06-27 MED ORDER — DEXTROSE-NACL 5-0.45 % IV SOLN: INTRAVENOUS | Status: DC

## 2020-06-27 MED ORDER — ONDANSETRON HCL 4 MG/2ML IJ SOLN
INTRAMUSCULAR | Status: DC | PRN
  Administered 2020-06-27: 4 mg via INTRAVENOUS

## 2020-06-27 MED ORDER — SODIUM CHLORIDE 0.9 % IV BOLUS
1000.0000 mL | Freq: Once | INTRAVENOUS | Status: AC
  Administered 2020-06-27: 1000 mL via INTRAVENOUS

## 2020-06-27 MED ORDER — CEFAZOLIN SODIUM-DEXTROSE 2-3 GM-%(50ML) IV SOLR
INTRAVENOUS | Status: DC | PRN
  Administered 2020-06-27: 2 g via INTRAVENOUS

## 2020-06-27 MED ORDER — FENTANYL CITRATE (PF) 100 MCG/2ML IJ SOLN
25.0000 ug | INTRAMUSCULAR | Status: DC | PRN
Start: 2020-06-27 — End: 2020-06-27

## 2020-06-27 MED ORDER — OXYCODONE HCL 5 MG PO TABS
5.0000 mg | ORAL_TABLET | ORAL | Status: DC | PRN
  Administered 2020-06-27 – 2020-06-28 (×3): 5 mg via ORAL
  Filled 2020-06-27 (×3): qty 1

## 2020-06-27 MED ORDER — IOHEXOL 300 MG/ML  SOLN
100.0000 mL | Freq: Once | INTRAMUSCULAR | Status: AC | PRN
  Administered 2020-06-27: 100 mL via INTRAVENOUS

## 2020-06-27 MED ORDER — SODIUM CHLORIDE 0.9 % IV SOLN: INTRAVENOUS | Status: DC | PRN

## 2020-06-27 MED ORDER — MORPHINE SULFATE (PF) 2 MG/ML IV SOLN
2.0000 mg | INTRAVENOUS | Status: DC | PRN
  Administered 2020-06-28: 4 mg via INTRAVENOUS
  Filled 2020-06-27: qty 2

## 2020-06-27 MED ORDER — ROCURONIUM BROMIDE 10 MG/ML (PF) SYRINGE
PREFILLED_SYRINGE | INTRAVENOUS | Status: DC | PRN
  Administered 2020-06-27: 50 mg via INTRAVENOUS

## 2020-06-27 MED ORDER — LACTATED RINGERS IV SOLN: INTRAVENOUS | Status: DC | PRN

## 2020-06-27 SURGICAL SUPPLY — 32 items
CANISTER SUCT 3000ML PPV (MISCELLANEOUS) IMPLANT
COVER SURGICAL LIGHT HANDLE (MISCELLANEOUS) ×3 IMPLANT
COVER WAND RF STERILE (DRAPES) ×3 IMPLANT
DERMABOND ADVANCED (GAUZE/BANDAGES/DRESSINGS) ×2
DERMABOND ADVANCED .7 DNX12 (GAUZE/BANDAGES/DRESSINGS) ×1 IMPLANT
DRAPE LAPAROSCOPIC ABDOMINAL (DRAPES) ×3 IMPLANT
DRAPE WARM FLUID 44X44 (DRAPES) ×3 IMPLANT
ELECT REM PT RETURN 9FT ADLT (ELECTROSURGICAL) ×3
ELECTRODE REM PT RTRN 9FT ADLT (ELECTROSURGICAL) ×1 IMPLANT
GLOVE ECLIPSE 8.0 STRL XLNG CF (GLOVE) ×12 IMPLANT
GLOVE INDICATOR 8.0 STRL GRN (GLOVE) ×3 IMPLANT
GOWN STRL REUS W/ TWL LRG LVL3 (GOWN DISPOSABLE) ×2 IMPLANT
GOWN STRL REUS W/ TWL XL LVL3 (GOWN DISPOSABLE) ×1 IMPLANT
GOWN STRL REUS W/TWL LRG LVL3 (GOWN DISPOSABLE) ×6
GOWN STRL REUS W/TWL XL LVL3 (GOWN DISPOSABLE) ×3
KIT BASIN OR (CUSTOM PROCEDURE TRAY) ×3 IMPLANT
KIT TURNOVER KIT B (KITS) ×3 IMPLANT
NS IRRIG 1000ML POUR BTL (IV SOLUTION) ×3 IMPLANT
PAD ARMBOARD 7.5X6 YLW CONV (MISCELLANEOUS) ×6 IMPLANT
SET IRRIG TUBING LAPAROSCOPIC (IRRIGATION / IRRIGATOR) IMPLANT
SET TUBE SMOKE EVAC HIGH FLOW (TUBING) ×3 IMPLANT
SHEARS HARMONIC ACE PLUS 36CM (ENDOMECHANICALS) ×3 IMPLANT
SLEEVE ENDOPATH XCEL 5M (ENDOMECHANICALS) ×3 IMPLANT
SOL PREP POV-IOD 4OZ 10% (MISCELLANEOUS) ×3 IMPLANT
SOL PREP PROV IODINE SCRUB 4OZ (MISCELLANEOUS) ×3 IMPLANT
SUT MNCRL AB 4-0 PS2 18 (SUTURE) IMPLANT
TOWEL GREEN STERILE (TOWEL DISPOSABLE) ×3 IMPLANT
TOWEL GREEN STERILE FF (TOWEL DISPOSABLE) ×3 IMPLANT
TRAY FOLEY MTR SLVR 14FR STAT (SET/KITS/TRAYS/PACK) ×3 IMPLANT
TRAY LAPAROSCOPIC MC (CUSTOM PROCEDURE TRAY) ×3 IMPLANT
TROCAR KII 12MM C0R29 THR SEP (TROCAR) ×3 IMPLANT
TROCAR XCEL NON-BLD 5MMX100MML (ENDOMECHANICALS) ×3 IMPLANT
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
## 2020-06-27 NOTE — Transfer of Care (Signed)
Immediate Anesthesia Transfer of Care Note  Patient: Bailey Carlson  Procedure(s) Performed: LAPAROSCOPY DIAGNOSTIC (N/A Abdomen)  Patient Location: PACU  Anesthesia Type:General  Level of Consciousness: awake, alert  and oriented  Airway & Oxygen Therapy: Patient Spontanous Breathing and Patient connected to nasal cannula oxygen  Post-op Assessment: Report given to RN and Post -op Vital signs reviewed and stable  Post vital signs: Reviewed and stable  Last Vitals:  Vitals Value Taken Time  BP    Temp    Pulse    Resp    SpO2      Last Pain:  Vitals:   06/27/20 1545  TempSrc:   PainSc: 8          Complications: No complications documented.
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
## 2020-06-27 NOTE — Anesthesia Preprocedure Evaluation (Signed)
Anesthesia Evaluation  Patient identified by MRN, date of birth, ID band Patient awake    Reviewed: Allergy & Precautions, NPO status , Patient's Chart, lab work & pertinent test resultsPreop documentation limited or incomplete due to emergent nature of procedure.  Airway Mallampati: II  TM Distance: >3 FB     Dental  (+) Dental Advisory Given   Pulmonary    breath sounds clear to auscultation       Cardiovascular  Rhythm:Regular Rate:Tachycardia     Neuro/Psych    GI/Hepatic   Endo/Other    Renal/GU      Musculoskeletal   Abdominal   Peds  Hematology   Anesthesia Other Findings   Reproductive/Obstetrics                             Anesthesia Physical Anesthesia Plan  ASA: III and emergent  Anesthesia Plan: General   Post-op Pain Management:    Induction: Intravenous and Rapid sequence  PONV Risk Score and Plan: 3 and Dexamethasone, Ondansetron and Treatment may vary due to age or medical condition  Airway Management Planned: Oral ETT  Additional Equipment: None  Intra-op Plan:   Post-operative Plan: Extubation in OR  Informed Consent: I have reviewed the patients History and Physical, chart, labs and discussed the procedure including the risks, benefits and alternatives for the proposed anesthesia with the patient or authorized representative who has indicated his/her understanding and acceptance.     Dental advisory given  Plan Discussed with: CRNA  Anesthesia Plan Comments:         Anesthesia Quick Evaluation
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
## 2020-06-27 NOTE — Anesthesia Procedure Notes (Signed)
Procedure Name: Intubation Date/Time: 06/27/2020 4:27 PM Performed by: Babs Bertin, CRNA Pre-anesthesia Checklist: Patient identified, Emergency Drugs available, Suction available and Patient being monitored Patient Re-evaluated:Patient Re-evaluated prior to induction Oxygen Delivery Method: Circle System Utilized Preoxygenation: Pre-oxygenation with 100% oxygen Induction Type: IV induction, Rapid sequence and Cricoid Pressure applied Laryngoscope Size: Mac and 3 Grade View: Grade I Tube type: Oral Tube size: 7.0 mm Number of attempts: 1 Airway Equipment and Method: Stylet and Oral airway Placement Confirmation: ETT inserted through vocal cords under direct vision,  positive ETCO2 and breath sounds checked- equal and bilateral Secured at: 21 cm Tube secured with: Tape Dental Injury: Teeth and Oropharynx as per pre-operative assessment
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
## 2020-06-28 ENCOUNTER — Encounter (HOSPITAL_COMMUNITY): Payer: Self-pay | Admitting: General Surgery

## 2020-06-28 MED ORDER — IBUPROFEN 800 MG PO TABS
800.0000 mg | ORAL_TABLET | Freq: Three times a day (TID) | ORAL | 0 refills | Status: DC | PRN
Start: 2020-06-28 — End: 2024-09-16

## 2020-06-28 MED ORDER — POTASSIUM CHLORIDE CRYS ER 20 MEQ PO TBCR
40.0000 meq | EXTENDED_RELEASE_TABLET | Freq: Two times a day (BID) | ORAL | Status: DC
  Administered 2020-06-28: 40 meq via ORAL
  Filled 2020-06-28: qty 2

## 2020-06-28 MED ORDER — POLYETHYLENE GLYCOL 3350 17 G PO PACK: 17.0000 g | PACK | Freq: Every day | ORAL | 0 refills | Status: DC | PRN

## 2020-06-28 MED ORDER — ACETAMINOPHEN 325 MG PO TABS: 650.0000 mg | ORAL_TABLET | ORAL | Status: DC | PRN

## 2020-06-28 MED ORDER — OXYCODONE HCL 5 MG PO TABS: 5.0000 mg | ORAL_TABLET | Freq: Four times a day (QID) | ORAL | 0 refills | Status: DC | PRN
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
## 2020-06-28 NOTE — Progress Notes (Signed)
Discharge information reviewed with patient and family. Patient discharged to home with family. No needs noted or verbalized at d/c.

## 2020-06-28 NOTE — Discharge Instructions (Signed)
[facility] ° °Please arrive at least 30 min before your appointment to complete your check in paperwork.  If you are unable to arrive 30 min prior to your appointment time we may have to cancel or reschedule you. °LAPAROSCOPIC SURGERY: POST OP INSTRUCTIONS °Always review your discharge instruction sheet given to you by the facility where your surgery was performed. °IF YOU HAVE DISABILITY OR FAMILY LEAVE FORMS, YOU MUST BRING THEM TO THE OFFICE FOR PROCESSING.   °DO NOT GIVE THEM TO YOUR DOCTOR. ° °PAIN CONTROL ° °1. First take acetaminophen (Tylenol) AND/or ibuprofen (Advil) to control your pain after surgery.  Follow directions on package.  Taking acetaminophen (Tylenol) and/or ibuprofen (Advil) regularly after surgery will help to control your pain and lower the amount of prescription pain medication you may need.  You should not take more than 4,000 mg (4 grams) of acetaminophen (Tylenol) in 24 hours.  You should not take ibuprofen (Advil), aleve, motrin, naprosyn or other NSAIDS if you have a history of stomach ulcers or chronic kidney disease.  °2. A prescription for pain medication may be given to you upon discharge.  Take your pain medication as prescribed, if you still have uncontrolled pain after taking acetaminophen (Tylenol) or ibuprofen (Advil). °3. Use ice packs to help control pain. °4. If you need a refill on your pain medication, please contact your pharmacy.  They will contact our office to request authorization. Prescriptions will not be filled after 5pm or on week-ends. ° °HOME MEDICATIONS °5. Take your usually prescribed medications unless otherwise directed. ° °DIET °6. You should follow a light diet the first few days after arrival home.  Be sure to include lots of fluids daily. Avoid fatty, fried foods.  ° °CONSTIPATION °7. It is common to experience some constipation after surgery and if you are taking pain medication.  Increasing fluid intake and taking a stool  softener (such as Colace) will usually help or prevent this problem from occurring.  A mild laxative (Milk of Magnesia or Miralax) should be taken according to package instructions if there are no bowel movements after 48 hours. ° °WOUND/INCISION CARE °8. Most patients will experience some swelling and bruising in the area of the incisions.  Ice packs will help.  Swelling and bruising can take several days to resolve.  °9. Unless discharge instructions indicate otherwise, follow guidelines below  °a. STERI-STRIPS - you may remove your outer bandages 48 hours after surgery, and you may shower at that time.  You have steri-strips (small skin tapes) in place directly over the incision.  These strips should be left on the skin for 7-10 days.   °b. DERMABOND/SKIN GLUE - you may shower in 24 hours.  The glue will flake off over the next 2-3 weeks. °10. Any sutures or staples will be removed at the office during your follow-up visit. ° °ACTIVITIES °11. You may resume regular (light) daily activities beginning the next day--such as daily self-care, walking, climbing stairs--gradually increasing activities as tolerated.  You may have sexual intercourse when it is comfortable.  Refrain from any heavy lifting or straining until approved by your doctor. °a. You may drive when you are no longer taking prescription pain medication, you can comfortably wear a seatbelt, and you can safely maneuver your car and apply brakes. ° °FOLLOW-UP °12. You should see your doctor in the office for a follow-up appointment approximately 2-3 weeks after your surgery.  You should have been given your post-op/follow-up appointment when   your surgery was scheduled.  If you did not receive a post-op/follow-up appointment, make sure that you call for this appointment within a day or two after you arrive home to insure a convenient appointment time. ° °OTHER INSTRUCTIONS ° °WHEN TO CALL YOUR DOCTOR: °1. Fever over 101.0 °2. Inability to  urinate °3. Continued bleeding from incision. °4. Increased pain, redness, or drainage from the incision. °5. Increasing abdominal pain ° °The clinic staff is available to answer your questions during regular business hours.  Please don’t hesitate to call and ask to speak to one of the nurses for clinical concerns.  If you have a medical emergency, go to the nearest emergency room or call 911.  A surgeon from [facility] is always on call at the hospital. °[address], [address],  [place], [place]  [address] ? [address] [address], [place], [place]   [address] °[phone] ? [phone] ? FAX [phone] ° ° ° °

## 2020-06-28 NOTE — Anesthesia Postprocedure Evaluation (Signed)
Anesthesia Post Note  Patient: Bailey Carlson  Procedure(s) Performed: LAPAROSCOPY DIAGNOSTIC (N/A Abdomen)     Patient location during evaluation: PACU Anesthesia Type: General Level of consciousness: awake and alert Pain management: pain level controlled Vital Signs Assessment: post-procedure vital signs reviewed and stable Respiratory status: spontaneous breathing, nonlabored ventilation, respiratory function stable and patient connected to nasal cannula oxygen Cardiovascular status: blood pressure returned to baseline and stable Postop Assessment: no apparent nausea or vomiting Anesthetic complications: no   No complications documented.  Last Vitals:  Vitals:   06/28/20 0347 06/28/20 0455  BP: 118/74   Pulse: 65   Resp: 18 18  Temp: 36.7 C   SpO2: 100% 94%    Last Pain:  Vitals:   06/28/20 0840  TempSrc:   PainSc: 2                  Kennieth Rad

## 2020-06-28 NOTE — Plan of Care (Signed)
  Problem: Education: Goal: Knowledge of General Education information will improve Description Including pain rating scale, medication(s)/side effects and non-pharmacologic comfort measures Outcome: Progressing   

## 2020-06-28 NOTE — Plan of Care (Signed)

## 2020-06-29 ENCOUNTER — Encounter (HOSPITAL_COMMUNITY): Payer: Self-pay

## 2020-07-06 ENCOUNTER — Other Ambulatory Visit: Payer: Self-pay

## 2020-07-06 ENCOUNTER — Emergency Department (HOSPITAL_COMMUNITY)
Admission: EM | Admit: 2020-07-06 | Discharge: 2020-07-06 | Disposition: A | Discharge status: Home / Self Care | Payer: Medicaid Other | Attending: Emergency Medicine | Admitting: Emergency Medicine

## 2020-07-06 DIAGNOSIS — Z87891 Personal history of nicotine dependence: Secondary | ICD-10-CM | POA: Diagnosis not present

## 2020-07-06 DIAGNOSIS — M545 Low back pain, unspecified: Secondary | ICD-10-CM

## 2020-07-06 LAB — CBC WITH DIFFERENTIAL/PLATELET
Abs Immature Granulocytes: 0.03 10*3/uL (ref 0.00–0.07)
Basophils Absolute: 0.1 10*3/uL (ref 0.0–0.1)
Basophils Relative: 1 %
Eosinophils Absolute: 0.2 10*3/uL (ref 0.0–0.5)
Eosinophils Relative: 2 %
HCT: 38.9 % (ref 36.0–46.0)
Hemoglobin: 12.7 g/dL (ref 12.0–15.0)
Immature Granulocytes: 0 %
Lymphocytes Relative: 34 %
Lymphs Abs: 4 10*3/uL (ref 0.7–4.0)
MCH: 29.2 pg (ref 26.0–34.0)
MCHC: 32.6 g/dL (ref 30.0–36.0)
MCV: 89.4 fL (ref 80.0–100.0)
Monocytes Absolute: 0.6 10*3/uL (ref 0.1–1.0)
Monocytes Relative: 5 %
Neutro Abs: 6.9 10*3/uL (ref 1.7–7.7)
Neutrophils Relative %: 58 %
Platelets: 339 10*3/uL (ref 150–400)
RBC: 4.35 MIL/uL (ref 3.87–5.11)
RDW: 14.2 % (ref 11.5–15.5)
WBC: 11.8 10*3/uL — ABNORMAL HIGH (ref 4.0–10.5)
nRBC: 0 % (ref 0.0–0.2)

## 2020-07-06 LAB — URINALYSIS, ROUTINE W REFLEX MICROSCOPIC
Bilirubin Urine: NEGATIVE
Glucose, UA: NEGATIVE mg/dL
Hgb urine dipstick: NEGATIVE
Ketones, ur: 5 mg/dL — AB
Nitrite: NEGATIVE
Protein, ur: NEGATIVE mg/dL
Specific Gravity, Urine: 1.025 (ref 1.005–1.030)
pH: 5 (ref 5.0–8.0)

## 2020-07-06 LAB — COMPREHENSIVE METABOLIC PANEL
ALT: 15 U/L (ref 0–44)
AST: 19 U/L (ref 15–41)
Albumin: 3.9 g/dL (ref 3.5–5.0)
Alkaline Phosphatase: 56 U/L (ref 38–126)
Anion gap: 10 (ref 5–15)
BUN: 6 mg/dL (ref 6–20)
CO2: 24 mmol/L (ref 22–32)
Calcium: 9.3 mg/dL (ref 8.9–10.3)
Chloride: 102 mmol/L (ref 98–111)
Creatinine, Ser: 0.74 mg/dL (ref 0.44–1.00)
GFR calc Af Amer: >60 mL/min (ref >60)
GFR calc non Af Amer: >60 mL/min (ref >60)
Glucose, Bld: 97 mg/dL (ref 70–99)
Potassium: 3.6 mmol/L (ref 3.5–5.1)
Sodium: 136 mmol/L (ref 135–145)
Total Bilirubin: 0.6 mg/dL (ref 0.3–1.2)
Total Protein: 8 g/dL (ref 6.5–8.1)

## 2020-07-06 LAB — I-STAT BETA HCG BLOOD, ED (MC, WL, AP ONLY): I-stat hCG, quantitative: <5 m[IU]/mL (ref <5)

## 2020-07-06 MED ORDER — HYDROCODONE-ACETAMINOPHEN 5-325 MG PO TABS: 2.0000 | ORAL_TABLET | Freq: Four times a day (QID) | ORAL | 0 refills | Status: DC | PRN

## 2020-07-06 MED ORDER — ACETAMINOPHEN 500 MG PO TABS: 1000.0000 mg | ORAL_TABLET | Freq: Once | ORAL | Status: DC

## 2020-07-06 NOTE — ED Triage Notes (Signed)
C/O irregular vaginal bleeding as well.
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
## 2020-07-06 NOTE — Discharge Instructions (Addendum)
I am prescribing you Vicodin.  This is a strong narcotic.  Please only take this as prescribed.  Please do not mix with alcohol.  I recommend a combination of tylenol and ibuprofen for management of your pain. You can take a low dose of both at the same time. I recommend 325 mg of Tylenol combined with 400 mg of ibuprofen. This is one regular Tylenol and two regular ibuprofen. You can take these 2 times for day for your pain. Please try to take these medications with a small amount of food as well to prevent upsetting your stomach.  Also, please consider topical pain relieving creams such as Voltaran Gel, BioFreeze, or Icy Hot. There is also a pain relieving cream made by [employer]. You should be able to find all of these at your local pharmacy.   Please return to the emergency department with any new or worsening symptoms.  It was a pleasure to meet you.
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
## 2020-07-06 NOTE — ED Notes (Signed)
Discharge instructions reviewed with pt. Pt verbalized understanding.   

## 2020-07-26 ENCOUNTER — Encounter: Payer: Self-pay | Admitting: Neurology

## 2020-09-05 ENCOUNTER — Other Ambulatory Visit: Payer: Self-pay

## 2020-09-05 ENCOUNTER — Ambulatory Visit (INDEPENDENT_AMBULATORY_CARE_PROVIDER_SITE_OTHER): Payer: Medicaid Other | Admitting: Neurology

## 2020-09-05 ENCOUNTER — Encounter: Payer: Self-pay | Admitting: Neurology

## 2020-09-05 VITALS — BP 125/83 | HR 98 | Resp 20 | Ht 62.0 in | Wt 196.0 lb

## 2020-09-05 DIAGNOSIS — M79604 Pain in right leg: Secondary | ICD-10-CM

## 2020-09-05 DIAGNOSIS — M5431 Sciatica, right side: Secondary | ICD-10-CM
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
## 2020-09-05 NOTE — Patient Instructions (Signed)
Start physical therapy for right leg stretching  Return to clinic in 4 months

## 2020-09-20 ENCOUNTER — Ambulatory Visit: Payer: Medicaid Other | Attending: Neurology | Admitting: Physical Therapy

## 2020-10-13 ENCOUNTER — Ambulatory Visit: Payer: Medicaid Other | Admitting: Neurology

## 2020-11-07 ENCOUNTER — Ambulatory Visit: Payer: Medicaid Other | Admitting: Neurology
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
## 2024-09-16 ENCOUNTER — Telehealth: Payer: Self-pay

## 2024-09-16 ENCOUNTER — Inpatient Hospital Stay (HOSPITAL_COMMUNITY)
Admission: AD | Admit: 2024-09-16 | Discharge: 2024-09-16 | Disposition: A | Discharge status: Home / Self Care | Attending: Obstetrics and Gynecology | Admitting: Obstetrics and Gynecology

## 2024-09-16 ENCOUNTER — Ambulatory Visit: Payer: Self-pay

## 2024-09-16 ENCOUNTER — Other Ambulatory Visit: Payer: Self-pay

## 2024-09-16 DIAGNOSIS — T8384XA Pain from genitourinary prosthetic devices, implants and grafts, initial encounter: Secondary | ICD-10-CM

## 2024-09-16 DIAGNOSIS — N939 Abnormal uterine and vaginal bleeding, unspecified: Secondary | ICD-10-CM | POA: Diagnosis present

## 2024-09-16 DIAGNOSIS — Z3202 Encounter for pregnancy test, result negative: Secondary | ICD-10-CM | POA: Insufficient documentation

## 2024-09-16 DIAGNOSIS — Z975 Presence of (intrauterine) contraceptive device: Secondary | ICD-10-CM | POA: Diagnosis present

## 2024-09-16 DIAGNOSIS — M5459 Other low back pain: Secondary | ICD-10-CM | POA: Diagnosis present

## 2024-09-16 LAB — HCG, QUANTITATIVE, PREGNANCY: hCG, Beta Chain, Quant, S: <1 m[IU]/mL (ref <5)

## 2024-09-16 LAB — POCT PREGNANCY, URINE: Preg Test, Ur: NEGATIVE

## 2024-09-16 NOTE — Telephone Encounter (Signed)
 Attempted to contact patient to schedule a IUD removal and possible IUD insertion on 10/17 per Harlene Duncans, CNM. Was unable to leave a voicemail. Sending patient a mychart message for her to contact the office.

## 2024-09-16 NOTE — MAU Note (Signed)
 Bailey Carlson  is a 28 y.o. at Unknown here in MAU reporting: she's had an IUD since 2018.  States she began having intermittent spotting last Monday though this past Saturday.  Also reports has right sided lower back pain accompanied with vaginal pain.  Reports took Tylenol  for pain a few days ago with some relief noted. Had a faint positive pregnancy test over the weekend.  LMP: 2018 Onset of complaint: 2 weeks ago Pain score: 8 Vitals:   09/16/24 0954  BP: 132/89  Pulse: 67  Resp: 18  Temp: 98.5 F (36.9 C)  SpO2: 99%     FHT: NA  Lab orders placed from triage: UPT
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
